# Patient Record
Sex: Female | Born: 1995 | Hispanic: Yes | Smoking: Never smoker
Health system: Southern US, Community
[De-identification: ages and names within clinical notes are randomized; demographics above are authoritative.]

## PROBLEM LIST (undated history)

## (undated) ENCOUNTER — Inpatient Hospital Stay (HOSPITAL_COMMUNITY): Payer: Self-pay

## (undated) DIAGNOSIS — R519 Headache, unspecified: Secondary | ICD-10-CM

## (undated) DIAGNOSIS — R569 Unspecified convulsions: Secondary | ICD-10-CM

## (undated) DIAGNOSIS — R51 Headache: Secondary | ICD-10-CM

## (undated) HISTORY — DX: Headache: R51

## (undated) HISTORY — DX: Headache, unspecified: R51.9

## (undated) HISTORY — PX: NO PAST SURGERIES: SHX2092

---

## 2014-06-15 ENCOUNTER — Encounter (HOSPITAL_COMMUNITY): Payer: Self-pay | Admitting: *Deleted

## 2014-06-15 ENCOUNTER — Emergency Department (HOSPITAL_COMMUNITY)
Admission: EM | Admit: 2014-06-15 | Discharge: 2014-06-15 | Disposition: A | Discharge status: Home / Self Care | Payer: Medicaid Other | Attending: Emergency Medicine | Admitting: Emergency Medicine

## 2014-06-15 DIAGNOSIS — J029 Acute pharyngitis, unspecified: Secondary | ICD-10-CM

## 2014-06-15 DIAGNOSIS — M79621 Pain in right upper arm: Secondary | ICD-10-CM | POA: Diagnosis not present

## 2014-06-15 DIAGNOSIS — R2231 Localized swelling, mass and lump, right upper limb: Secondary | ICD-10-CM | POA: Diagnosis present

## 2014-06-15 MED ORDER — IBUPROFEN 600 MG PO TABS: 600.0000 mg | ORAL_TABLET | Freq: Four times a day (QID) | ORAL | Status: DC | PRN

## 2014-06-15 NOTE — Discharge Instructions (Signed)
Please read and follow all provided instructions.  Your diagnoses today include:  1. Axillary pain, right   2. Sore throat    Tests performed today include:  Vital signs. See below for your results today.   Medications prescribed:   Ibuprofen (Motrin, Advil) - anti-inflammatory pain medication  Do not exceed 600mg  ibuprofen every 6 hours, take with food  You have been prescribed an anti-inflammatory medication or NSAID. Take with food. Take smallest effective dose for the shortest duration needed for your pain. Stop taking if you experience stomach pain or vomiting.   Take any prescribed medications only as directed.   Home care instructions:   Follow any educational materials contained in this packet  Follow-up instructions: Please follow-up with your primary care provider in the next 2 weeks for further evaluation of your symptoms if you do not feel better.   Return instructions:  Return to the Emergency Department if you have:  Fever  Worsening symptoms  Worsening pain  Worsening swelling  Redness of the skin that moves away from the affected area, especially if it streaks away from the affected area   Any other emergent concerns  Your vital signs today were: BP 135/81 mmHg   Pulse 102   Temp(Src) 98.2 F (36.8 C) (Oral)   Resp 24   SpO2 98%   LMP 06/01/2014 If your blood pressure (BP) was elevated above 135/85 this visit, please have this repeated by your doctor within one month. --------------

## 2014-06-15 NOTE — ED Notes (Signed)
The pt is c/o a lump under her rt arm for 3-4 days.  No temp  lmp this month  She  Also has a lump in her throat

## 2014-06-15 NOTE — ED Provider Notes (Signed)
CSN: [id number]     Arrival date & time 06/15/14  2231 History  This chart was scribed for non-physician practitioner, Rhea BleacherJosh Metha Kolasa, PA-C working with Elwin MochaBlair Walden, MD by Angelene GiovanniEmmanuella Mensah, ED Scribe. The patient was seen in room D32C/D32C and the patient's care was started at 10:53 PM      Chief Complaint  Patient presents with  . lump under rt arm    The history is provided by the patient. No language interpreter was used.   HPI Comments: Katherine Hess is a 19 y.o. female who presents to the Emergency Department complaining of lump under her right arm onset 2 days ago. She reports associated sore throat. She denies any fever, rhinorrhea, vomiting. She denies taking any medications for her symptoms. She denies any hx of skin infection or skin problems. No drainage from axilla. No drainage from breast.   History reviewed. No pertinent past medical history. History reviewed. No pertinent past surgical history. No family history on file. History  Substance Use Topics  . Smoking status: Never Smoker   . Smokeless tobacco: Not on file  . Alcohol Use: No   OB History    No data available     Review of Systems  Constitutional: Negative for fever, chills and fatigue.  HENT: Positive for sore throat. Negative for congestion, ear pain, rhinorrhea and sinus pressure.   Eyes: Negative for redness.  Respiratory: Negative for cough and wheezing.   Gastrointestinal: Negative for nausea, vomiting, abdominal pain and diarrhea.  Genitourinary: Negative for dysuria.  Musculoskeletal: Negative for myalgias and neck stiffness.  Skin: Negative for color change and rash.       Positive for lump in armpit.   Neurological: Negative for headaches.  Hematological: Negative for adenopathy.      Allergies  Review of patient's allergies indicates no known allergies.  Home Medications   Prior to Admission medications   Not on File   BP 135/81 mmHg  Pulse 102  Temp(Src) 98.2 F (36.8 C) (Oral)   Resp 24  SpO2 98%  LMP 06/01/2014 Physical Exam  Constitutional: She is oriented to person, place, and time. She appears well-developed and well-nourished. No distress.  HENT:  Head: Normocephalic and atraumatic.  Mouth/Throat: Oropharynx is clear and moist. No oropharyngeal exudate.  Eyes: Conjunctivae and EOM are normal. Right eye exhibits no discharge. Left eye exhibits no discharge.  Neck: Normal range of motion. Neck supple. No tracheal deviation present.  Full ROM of neck without pain. Patient reports some anterior neck tenderness to palpation. No definite lymphadenopathy. No erythema or cellulitis.   Cardiovascular: Normal rate, regular rhythm and normal heart sounds.   Pulmonary/Chest: Effort normal and breath sounds normal. No respiratory distress.  Abdominal: Soft. There is no tenderness.  Musculoskeletal: Normal range of motion.  Lymphadenopathy:    She has no cervical adenopathy.  Neurological: She is alert and oriented to person, place, and time.  Skin: Skin is warm and dry.  After approximately 2 minutes I palpated a small, pea-sized, tender nodule over the lateral aspect of the right breast, more so on the lateral aspect of the chest wall. No overlying erythema or cellulitis. Area soft, non-fluctuant. It has the consistency of a small lymph node. No other breast lumps.   Psychiatric: She has a normal mood and affect. Her behavior is normal.  Nursing note and vitals reviewed.   ED Course  Procedures (including critical care time) DIAGNOSTIC STUDIES: Oxygen Saturation is 98% on RA, normal by my interpretation.  COORDINATION OF CARE: 10:59 PM- Pt advised of plan for treatment and pt agrees.    Labs Review Labs Reviewed - No data to display  Imaging Review No results found.   EKG Interpretation None       Vital signs reviewed and are as follows: Filed Vitals:   06/15/14 2235  BP: 135/81  Pulse: 102  Temp: 98.2 F (36.8 C)  Resp: 24   Patient counseled  to use warm compresses and ibuprofen for next 2 weeks.   If area improves -- no further action.   If area worsens, she has worsening pain, swelling, expanding erythema especially if it streaks away from the affected area, or fever -- she should return to the ED for further evaluation.   If she continues to feel a tender lump in 2 weeks that is not changing -- she should follow-up with her primary care physician. She says she has one but cannot remember name right now.    Patient is in agreement with this plan.   The patient verbalized understanding and stated agreement with this plan.     MDM   Final diagnoses:  Axillary pain, right  Sore throat   Axillary pain: small deep nodule palpated with some effort. Doubt abscess. Possible sebaceous cyst. Possible reactive lymph node. Doubt neoplasm as area is tender but bears watching. Plan as above.   Sore throat: Mild, does not appear to have strep on exam, CENTOR 0/4. NSAIDs and monitor. Full ROM of neck, do not suspect deep abscess. Pt non-toxic. No difficulty breathing or swallowing.   I personally performed the services described in this documentation, which was scribed in my presence. The recorded information has been reviewed and is accurate.     Renne Crigler, PA-C 06/15/14 2324  Elwin Mocha, MD 06/15/14 [phone]

## 2014-07-26 ENCOUNTER — Emergency Department: Payer: Medicaid - Out of State

## 2014-07-26 ENCOUNTER — Emergency Department
Admission: EM | Admit: 2014-07-26 | Discharge: 2014-07-26 | Disposition: A | Discharge status: Home / Self Care | Payer: Medicaid - Out of State | Attending: Emergency Medicine | Admitting: Emergency Medicine

## 2014-07-26 DIAGNOSIS — R079 Chest pain, unspecified: Secondary | ICD-10-CM | POA: Insufficient documentation

## 2014-07-26 LAB — URINALYSIS, REFLEX TO MICROSCOPIC EXAM IF INDICATED
Bilirubin, UA: NEGATIVE
Blood, UA: NEGATIVE
Glucose, UA: NEGATIVE
Ketones UA: 40 — AB
Leukocyte Esterase, UA: NEGATIVE
Nitrite, UA: NEGATIVE
Specific Gravity UA: 1.019 (ref 1.001–1.035)
Urine pH: 5.5 (ref 5.0–8.0)
Urobilinogen, UA: NORMAL mg/dL

## 2014-07-26 LAB — COMPREHENSIVE METABOLIC PANEL
ALT: 14 U/L (ref 0–55)
AST (SGOT): 15 U/L (ref 5–34)
Albumin/Globulin Ratio: 1.8 (ref 0.9–2.2)
Albumin: 4.4 g/dL (ref 3.5–5.0)
Alkaline Phosphatase: 48 U/L — ABNORMAL LOW (ref 50–130)
BUN: 9 mg/dL (ref 7.0–19.0)
Bilirubin, Total: 0.4 mg/dL (ref 0.2–1.2)
CO2: 23 mEq/L (ref 22–29)
Calcium: 9.6 mg/dL (ref 8.5–10.5)
Chloride: 108 mEq/L (ref 100–111)
Creatinine: 0.7 mg/dL (ref 0.6–1.0)
Globulin: 2.4 g/dL (ref 2.0–3.6)
Glucose: 82 mg/dL (ref 70–100)
Potassium: 3.9 mEq/L (ref 3.5–5.1)
Protein, Total: 6.8 g/dL (ref 6.0–8.3)
Sodium: 141 mEq/L (ref 136–145)

## 2014-07-26 LAB — CBC AND DIFFERENTIAL
Basophils Absolute Automated: 0.05 10*3/uL (ref 0.00–0.20)
Basophils Automated: 0 %
Eosinophils Absolute Automated: 0.15 10*3/uL (ref 0.00–0.70)
Eosinophils Automated: 1 %
Hematocrit: 41.6 % (ref 37.0–47.0)
Hgb: 14.4 g/dL (ref 12.0–16.0)
Immature Granulocytes Absolute: 0.02 10*3/uL
Immature Granulocytes: 0 %
Lymphocytes Absolute Automated: 2.76 10*3/uL (ref 0.50–4.40)
Lymphocytes Automated: 25 %
MCH: 31.4 pg (ref 28.0–32.0)
MCHC: 34.6 g/dL (ref 32.0–36.0)
MCV: 90.8 fL (ref 80.0–100.0)
MPV: 10.7 fL (ref 9.4–12.3)
Monocytes Absolute Automated: 0.8 10*3/uL (ref 0.00–1.20)
Monocytes: 7 %
Neutrophils Absolute: 7.23 10*3/uL (ref 1.80–8.10)
Neutrophils: 66 %
Nucleated RBC: 0 /100 WBC (ref 0–1)
Platelets: 250 10*3/uL (ref 140–400)
RBC: 4.58 10*6/uL (ref 4.20–5.40)
RDW: 12 % (ref 12–15)
WBC: 11.01 10*3/uL — ABNORMAL HIGH (ref 3.50–10.80)

## 2014-07-26 LAB — POCT PREGNANCY TEST, URINE HCG: POCT Pregnancy HCG Test, UR: NEGATIVE

## 2014-07-26 LAB — LIPASE: Lipase: 21 U/L (ref 8–78)

## 2014-07-26 MED ORDER — IBUPROFEN 600 MG PO TABS
600.0000 mg | ORAL_TABLET | Freq: Once | ORAL | Status: AC
Start: 2014-07-26 — End: 2014-07-26
  Administered 2014-07-26: 600 mg via ORAL
  Filled 2014-07-26: qty 1

## 2014-07-26 NOTE — Discharge Instructions (Signed)
Chest Pain of Unclear Etiology    You have been seen for chest pain. The cause of your pain is not yet known.    Your doctor has learned about your medical history, examined you, and checked any tests that were done. Still, it is unclear why you are having pain. The doctor thinks there is only a very small chance that your pain is caused by a life-threatening condition. Later, your primary care doctor might do more tests or check you again.    Sometimes chest pain is caused by a dangerous condition, like a heart attack, aorta injury, blood clot in the lung, or collapsed lung. It is unlikely that your pain is caused by a life-threatening condition if: Your chest pain lasts only a few seconds at a time; you are not short of breath, nauseated (sick to your stomach), sweaty, or lightheaded; your pain gets worse when you twist or bend; your pain improves with exercise or hard work.    Chest pain is serious. It is VERY IMPORTANT that you follow up with your regular doctor and seek medical attention immediately here or at the nearest Emergency Department if your symptoms become worse or they change.    YOU SHOULD SEEK MEDICAL ATTENTION IMMEDIATELY, EITHER HERE OR AT THE NEAREST EMERGENCY DEPARTMENT, IF ANY OF THE FOLLOWING OCCURS:   Your pain gets worse.   Your pain makes you short of breath, nauseated, or sweaty.   Your pain gets worse when you walk, go up stairs, or exert yourself.   You feel weak, lightheaded, or faint.   It hurts to breathe.   Your leg swells.   Your symptoms get worse or you have new symptoms or concerns.    [place] Referral Line    You have been referred to a primary care doctor or a specialist for follow-up care. Please call the [place] referral line and they will be able to help you find a physician in your area that you can follow up with.    Phone: 1-855-IMG-DOCS or [phone]

## 2014-07-26 NOTE — ED Provider Notes (Signed)
[facility] EMERGENCY DEPARTMENT H&P         CLINICAL SUMMARY          Diagnosis:    .     Final diagnoses:   Chest pain, unspecified         MDM Notes:      Chest pain - no abdominal complaints on my history - most likely musculoskeletal.  No risks for PE -  CXR to rule out pneumonia - doubt PTX.    Disposition:         Discharge         New Prescriptions    No medications on file                 CLINICAL INFORMATION        HPI:      Chief Complaint: Chest Pain  .    Julia [name] is a 19 y.o. female with no pertinent PMHx who presents c/o substernal CP with associated headache for the past two days. She has not had any recent sick contacts. Non smoker. Not on any BC. LMP last month. Denies dysuria, hematuria, vomiting, diarrhea, fevers, SOB.     History obtained from: Patient      ROS:      Positive and negative ROS elements as per HPI.      Physical Exam:      Pulse 85  BP 137/77 mmHg  Resp 18  SpO2 100 %  Temp 98.3 F (36.8 C)  Physical Exam   Constitutional: She is oriented to person, place, and time. She appears well-developed and well-nourished.   WD female - alert, NAD   HENT:   Head: Normocephalic and atraumatic.   Eyes: Conjunctivae are normal. Pupils are equal, round, and reactive to light.   Neck: Normal range of motion. Neck supple. No JVD present. No tracheal deviation present. No thyromegaly present.   Cardiovascular: Normal rate, regular rhythm and intact distal pulses.  Exam reveals no gallop and no friction rub.    No murmur heard.  Pulmonary/Chest: Effort normal. No stridor. No respiratory distress. She has no wheezes. She has no rales. She exhibits no tenderness.   Mild anterior chest tenderness over sternal borders -   Reproduces pain.   Abdominal: Soft. Bowel sounds are normal. She exhibits no distension and no mass. There is no tenderness. There is no rebound and no guarding.   NO tenderness.   Musculoskeletal: Normal range of motion. She exhibits no edema  or tenderness.   Lymphadenopathy:     She has no cervical adenopathy.   Neurological: She is alert and oriented to person, place, and time. No cranial nerve deficit. She exhibits normal muscle tone. Coordination abnormal.   Skin: Skin is warm. No rash noted. No erythema. No pallor.   Psychiatric: She has a normal mood and affect. Her behavior is normal. Judgment and thought content normal.   Nursing note and vitals reviewed.                PAST HISTORY        Primary Care Provider: Christa See, MD        PMH/PSH:    .     History reviewed. No pertinent past medical history.    She has no past surgical history on file.      Social/Family History:      She reports that she has never smoked. She does not have any smokeless tobacco history on file.  She reports that she does not drink alcohol or use illicit drugs.    History reviewed. No pertinent family history.      Listed Medications on Arrival:    .     Home Medications     Last Medication Reconciliation Action:  Complete Venancio Poisson, RN 07/26/2014  6:51 PM          No Medications         Allergies: She has No Known Allergies.            VISIT INFORMATION        Clinical Course in the ED:            Medications Given in the ED:    .     ED Medication Orders     Start     Status Ordering Provider    07/26/14 1954  ibuprofen (ADVIL,MOTRIN) tablet 600 mg   Once     Route: Oral  Ordered Dose: 600 mg     Last MAR action:  Given Bolden Hagerman            Procedures:      Procedures      Interpretations:      O2 sat-           saturation: 100 %; Oxygen use: room air; Interpretation: Normal      EKG Interpretation at 8:30 PM by me: NSR 71, no ischemic changes.             RESULTS        Lab Results:      Results     Procedure Component Value Units Date/Time    Urine HCG, POC/ Qualitative [[id number]] Collected:  07/26/14 1819    Specimen Information:  Urine Updated:  07/26/14 1904     POCT QC Pass      POCT Pregnancy HCG Test, UR Negative      Comment:         Result:        Negative Value is Normal in Healthy Males or Healthy non-pregnant Females    Lipase [[id number]] Collected:  07/26/14 1823    Specimen Information:  Blood Updated:  07/26/14 1857     Lipase 21 U/L     Comprehensive metabolic panel [[id number]]  (Abnormal) Collected:  07/26/14 1823    Specimen Information:  Blood Updated:  07/26/14 1857     Glucose 82 mg/dL      BUN 9.0 mg/dL      Creatinine 0.7 mg/dL      Sodium 423 mEq/L      Potassium 3.9 mEq/L      Chloride 108 mEq/L      CO2 23 mEq/L      CALCIUM 9.6 mg/dL      Protein, Total 6.8 g/dL      Albumin 4.4 g/dL      AST (SGOT) 15 U/L      ALT 14 U/L      Alkaline Phosphatase 48 (L) U/L      Bilirubin, Total 0.4 mg/dL      Globulin 2.4 g/dL      Albumin/Globulin Ratio 1.8     UA, Reflex to Microscopic [[id number]]  (Abnormal) Collected:  07/26/14 1826    Specimen Information:  Urine Updated:  07/26/14 1852     Urine Type Clean Catch      Color, UA Yellow      Clarity, UA Cloudy (A)  Specific Gravity UA 1.019      Urine pH 5.5      Leukocyte Esterase, UA Negative      Nitrite, UA Negative      Protein, UR Trace (A)      Glucose, UA Negative      Ketones UA 40 (A)      Urobilinogen, UA Normal mg/dL      Bilirubin, UA Negative      Blood, UA Negative      RBC, UA 0 - 5 /hpf      WBC, UA 0 - 5 /hpf      Squamous Epithelial Cells, Urine 11 - 25 /hpf      Urine Mucus Present     CBC with differential [[id number]]  (Abnormal) Collected:  07/26/14 1823    Specimen Information:  Blood / Blood Updated:  07/26/14 1846     WBC 11.01 (H) x10 3/uL      Hgb 14.4 g/dL      Hematocrit 25.3 %      Platelets 250 x10 3/uL      RBC 4.58 x10 6/uL      MCV 90.8 fL      MCH 31.4 pg      MCHC 34.6 g/dL      RDW 12 %      MPV 10.7 fL      Neutrophils 66 %      Lymphocytes Automated 25 %      Monocytes 7 %      Eosinophils Automated 1 %      Basophils Automated 0 %      Immature Granulocyte 0 %      Nucleated RBC 0 /100 WBC      Neutrophils Absolute 7.23 x10 3/uL      Abs Lymph  Automated 2.76 x10 3/uL      Abs Mono Automated 0.80 x10 3/uL      Abs Eos Automated 0.15 x10 3/uL      Absolute Baso Automated 0.05 x10 3/uL      Absolute Immature Granulocyte 0.02 x10 3/uL               Radiology Results:      Chest 2 Views   Final Result      There is no evidence of acute disease.      Stephannie Peters, MD    07/26/2014 10:01 PM                     Scribe Attestation:      I was acting as a Neurosurgeon for Maurine Minister, MD on Julia [name]   I am the first provider for this patient and I personally performed the services documented. Joni Reining is scribing for me on Julia [name],Julia [name]. This note accurately reflects work and decisions made by me.  Maurine Minister, MD       10:11 PM    Feeling better after ibuprofen - stable for discharge.    Maurine Minister, MD  07/27/14 [phone]

## 2014-07-27 LAB — ECG 12-LEAD
Atrial Rate: 71 {beats}/min
P Axis: 39 degrees
P-R Interval: 138 ms
Q-T Interval: 398 ms
QRS Duration: 96 ms
QTC Calculation (Bezet): 432 ms
R Axis: 53 degrees
T Axis: 27 degrees
Ventricular Rate: 71 {beats}/min

## 2014-08-09 ENCOUNTER — Emergency Department
Admission: EM | Admit: 2014-08-09 | Discharge: 2014-08-09 | Disposition: A | Discharge status: Home / Self Care | Payer: Medicaid - Out of State | Attending: Emergency Medicine | Admitting: Emergency Medicine

## 2014-08-09 ENCOUNTER — Emergency Department: Payer: Medicaid - Out of State

## 2014-08-09 DIAGNOSIS — N644 Mastodynia: Secondary | ICD-10-CM | POA: Insufficient documentation

## 2014-08-09 DIAGNOSIS — N63 Unspecified lump in breast: Secondary | ICD-10-CM | POA: Insufficient documentation

## 2014-08-09 NOTE — Discharge Instructions (Signed)
1. Go to health department to obtain order for a mammogram    2. Then go to [facility] for a mammogram    3. Warm compresses 3 times a day    4. Tylenol and/or ibuprofen as needed for pain    5. Return to ER if develop worsening swelling, redness, fevers, vomiting, or drainage           Bulto en la mama.     Breast Lump     1.  Usted ha sido atendida por un bulto en su mama.   1.  You have been seen for a lump in your breast.             2.  Los bultos en la mama pueden ser benignos (no cancerosos) Tambin pueden ser EchoStar (cancerosos). Puede ser difcil diferenciar entre los bultos peligrosos y no peligrosos. Por lo tanto, se requieren ms pruebas. Este tipo de pruebas se hacen de Theme park manager. Esto significa que las pruebas se programan para otro momento sin tener que estar hospitalizada. Estas incluyen:   2.  Breast lumps can be benign (non-cancerous). They can also be malignant (cancerous). It can be hard to tell the difference between dangerous and non-dangerous lumps. Therefore, more testing is needed. This kind of testing is done as an outpatient. This means the tests are scheduled for a later time without staying in the hospital. These include:      * Ultrasonografa: un ultrasonido del bulto de la mama. Esto es para ver si est llena de lquido o slido.    * Ultrasonography: An ultrasound of the breast lump. This is to see if it is filled with fluid or is solid.      * Mamografa: Thereasa Solo es una radiografa especial para la mama. Encuentra bultos sospechosos de cncer.    * Mammography: A mammogram is a special x-ray of the breast. It finds lumps that are suspicious for cancer.      * Biopsia: con una biopsia, se toma una muestra del bulto. Generalmente se toma con Marella Bile. Luego se manda al laboratorio para Continental Airlines.    * Biopsy: With a biopsy, a sample of the lump is taken. It is usually taken with a needle. It then goes to the lab for  analysis.             3.  Enfermedad fibroqustica de las mamas: esta es una condicin comn. Afecta aproximadamente a un 20% (2 de 10) de las Land O'Lakes pubertad y los [place]. Generalmente desaparece con la menopausia. Es una condicin benigna (no cancerosa). Sin embargo, puede ser difcil de Home Depot bultos de los que son causados por cncer. Debido a esto, se deberan Investment banker, corporate de diagnstico. Esto es para asegurarse de que los bultos sean realmente por enfermedad fibroqustica. Algunos sntomas comunes de la enfermedad fibroqustica son:   3.  Fibrocystic Breast Disease: This is a common condition. It affects about 20% (2 out of 10) of women between the ages of puberty and 50 years. It usually goes away after menopause. It is a benign condition (not cancerous). However, it can be hard to tell the lumps apart from lumps caused by cancer. Because of this, other diagnostic tests should be done. These are to make sure the lumps are truly fibrocystic disease. Some common fibrocystic disease symptoms are:      * Es comn tener varios bultos. Generalmente afecta a ambas mamas. Tambin se ven  bultos solitarios.    * Multiple lumps are common. Both breasts are usually affected. Solitary (single) lumps are also seen.      * A menudo los bultos cambian de tamao con la menstruacin (perodos). Se hacen ms grandes antes de los perodos y Express Scripts se encogen.    * Lumps often change size during menstruation (periods). They get larger before periods and shrink after.      * Durante la menstruacin, generalmente las mamas pueden estar sensibles.    * During menstruation, breasts may be generally tender.             4.  Bultos por cncer de mama: a menudo es difcil diferenciar los bultos cancerosos de los que son causados por problemas menos peligrosos. Se necesitarn ms pruebas. Estas evaluarn a fondo los bultos de [place].   4.  Cancerous Breast Lumps: It is often  hard to tell cancerous lumps apart from those caused by less dangerous problems. More testing will be needed. These will fully evaluate the breast lumps.             5.  Recomendaciones sobre el mamograma: las recomendaciones sobre cundo Energy manager y con qu frecuencia realizarse otros pueden cambiar con Dowell. Pida a su mdico ms informacin sobre la [place].   5.  Mammogram recommendations: Recommendations about when to have the first mammogram and how often to get repeat mammograms may change over time. Ask your doctor for more information on mammography.      * Las recomendaciones actuales indican realizarse mamografas rutinarias una vez al ao a partir de los 40 aos.    * Current recommendations are for routine mammograms once a year starting at the age of 40.      * Si est en gran riesgo de cncer de mama, realcese una mamografa desde antes de los 40. Esto podra ser alrededor de los 30. Alto riesgo significa que tiene un fuerte historial familiar de cncer de mama u ovario o ha tenido tratamientos de radiacin en el pecho.    * If you are at high risk for breast cancer, get a mammogram when you are younger. This could be around the age of 35. High risk means you have a strong family history of breast or ovarian cancer or have had chest radiation treatment.                        Auto-exploraciones de seno (Edu.)     Breast-Self Examinations (Edu)     1.  Aqu tiene informacin sobre autoexploracin de seno.   1.  Here is some information on Breast Self-Examinations (BSE).             2.  Es importante realizar exmenes de sus senos mensualmente. Esto puede ayudarle a Chief Strategy Officer de seno a tiempo. Es muy importante porque es la mejor etapa para Engineer, manufacturing a Scientist, research (physical sciences) de seno. Es cuando hay ms posibilidades de ser curada. No todos los cnceres pueden encontrarse de Intel. Es un paso muy importante que puede hacer para usted y  Ambulance person.    2.  It is important to do monthly exams on your breasts. This can help you find breast cancer early. This is so important because this is the best time to find breast cancer is early. This is when it is most likely to be cured. Not every cancer can be found this way. It  is an important step you can do for yourself in your own home.              3.  Muchas mujeres piensan que las autoexploraciones de seno son frustrantes. Usted puede sentir cosas (masas o bultos) y no saber qu son. Mientras ms examine sus senos regularmente, ms aprender Hexion [employer]ty [employer]s. Entonces ser ms fcil de saber si hay algo anormal. La autoexploracin de seno es una parte importante de la salud preventiva.   3.  Many women find breast self-exams frustrating. You may feel things (lumps and bumps) and not know what they mean. The more you examine your breasts on a regular basis, the more you will learn about them. Then it will be easier to tell if something unusual shows up. BSE is an important part of preventative health.             4.  Procure hacerse una autoexploracin de seno una vez al mes. Feliberto Harts, se familiarizar con su cuerpo y con el aspecto y sensacin de sus senos. Hgase el examen unos das despus de que haya terminado su perodo. Es cuando sus senos son menos propensos a Warehouse manager hinchazn y Engineer, mining. Si ya no tiene el perodo, elija un da que le sea fcil de Clinical research associate. Por ejemplo, pudiera ser Mellon Financial primero o el ltimo del mes. Sea constante con CIGNA.   4.  Get in the habit of doing a breast self-examination once every month. This way, you will get familiar with your body and with how your breasts look and feel. Do the exam a few days after your period ends. This is when your breasts are least likely to be swollen and painful. If you do not have periods anymore, choose a day that s easy to remember. This could be the first or last day of the month. Be consistent in these days.              5.  Hay CINCO pasos para realizar su autoexploracin de seno:   5.  There are FIVE steps for your SBE:      * Comience mirando sus senos en el espejo. Ponga sus manos en sus caderas. Observe si el tamao, la forma y el color de sus senos son normales.    * Start by looking at your breasts in the mirror. Put your arms on your hips. Look for your normal breast size, shape and color.      * A continuacin, haga esto con ambos brazos levantados. Observe si hay hinchazn, hoyuelos, arrugas o bultos en la piel de forma anormal. Asegrese de que sus pezones no estn hundidos en lugar de salir hacia fuera como de costumbre. Ponga atencin a cualquier cosa que pudiera salir del pezn (secrecin).    * Then, do this with both your arms straight in the air. Look if the skin swells, dimples, puckers or bulges in a way that is not normal. Look for a nipple that has pushed inward instead of normally sticking outward. Watch for anything coming out of the nipple (discharge).      * Mientras mira al espejo, presione ligeramente ambos pezones y observe si tiene algn lquido o secrecin. La secrecin podra ser lechosa o de color amarillo. Incluso podra Intel.    * While looking into the mirror, gently squeeze both nipples to check for any fluid or discharge. The discharge could milky or yellow in color. It could even be bloody.      *  A continuacin, toque sus senos mientras est acostada. Toque su seno izquierdo con Psychiatric nurse. Luego toque su seno derecho con su mano izquierda. Use un tacto suave y firme con los primeros dedos de su mano. Mantenga sus dedos rectos y juntos. Revise el seno completo desde arriba Phoebe Sharps y de lado a lado. Haga esto desde su clavcula hasta la parte superior de su estmago y desde la axila hasta la zona del escote. Un aceite corporal o una crema hidratante podran facilitar los movimientos. Use un tacto suave y firme con los primeros dedos de su mano.  Mantenga sus dedos rectos y juntos. Asegrese de seguir un patrn. De esta manera podr asegurarse de revisar el seno completo. Comience en el pezn y desplace su mano en crculos cada vez ms grandes. Contine hasta llegar al extremo exterior del seno. Tambin puede mover sus dedos verticalmente, arriba y abajo, en hileras. Toque todo el tejido del seno: justo bajo su piel con un tacto suave y ms profundamente con un toque firme. Comience a examinar cada zona con un tacto muy suave. Luego, aplique ms presin para que pueda sentir el tejido ms profundo, bajo sus costillas.    * Next, feel your breasts while lying down. Feel your left breast with your right hand. Then feel your right breast with your left hand. Use a firm, smooth touch with the first few fingers of your hand. Keep the fingers flat and together. Cover the entire breast from top to bottom and side to side. Do this from your collarbone to the top of your belly and from your armpit to your cleavage. Body oil or moisturizer may make the movements flow more easily. Use a firm, smooth touch with the first few fingers of your hand. Keep the fingers flat and together. Be sure to follow a pattern. This way, you can be sure to cover the entire breast. Start at the nipple and move in larger and larger circles. Continue until you reach the outer edge of the breast. You can also move your fingers up and down vertically, in rows. Feel all the breast tissue: just under your skin with a soft touch and down deeper with a firmer touch. Start examining each area with a very soft touch. Then, use more pressure so you can feel the deeper tissue, down to your ribs.      * Para el ltimo paso, toque sus senos mientras est de pie o sentada. Muchas mujeres encuentran que la manera ms fcil de tocar sus senos es cuando su piel est hmeda y [place]. Esto se hace mejor en la ducha. Revise su seno completo, usando los mismos movimientos de la mano descritos en el paso  anterior.    * For the last step, feel your breasts while standing or sitting. Many women find that the easiest way to feel their breasts is when their skin is wet and slippery. This is best done in the shower. Cover your entire breast, using the same hand movements described in the previous step.             6.  No se asuste si cree que nota una masa pequea. La Harley-Davidson de las mujeres tienen masas pequeas o zonas con bultos en sus senos todo [place]. Ocho de cada diez masas pequeas que son retiradas de los senos son benignas. Esto significa que no son cancerosas.   6.  Don t panic if you think you feel a lump. Most women  have lumps or lumpy areas in their breasts all the time. Eight out of ten breast lumps that are removed are benign. This means they are not cancerous.             7.  Si tiene dudas o preguntas, hgaselo saber a su especialista y programe una revisin. Haga lo mismo si nota algn cambio en sus autoexploraciones.   7.  If you have any concerns or questions, tell your health care provider and schedule a check-up. Do the same if you notice any changes in your self-exams.             8.  Las autoexploraciones de seno regulares, junto con un examen anual realizado por un mdico, aumentan las posibilidades de Clinical research associate un cncer a tiempo.   8.  Regular breast self-exams, together with an annual exam by a doctor, improve the chances of finding cancer early.             9.  Para ms informacin, pngase en contacto con la American Cancer Society (Sociedad Americana del Cncer). Tambin puede visitar www.breastcancer.org   9.  For more information contact the American Cancer Society. You can also visit www.breastcancer.org                 Low Cost Healthcare Resources in [facility]/Federally Qualified Health Centers    [facility]   www.https://www.krueger.org/    [facility]  [address]  [place], [place] [address]   [phone]    Pediatrics, [address], [place], [place] [address], [phone]    [facility] and Mental [facility] Services, [phone]     ---------------------------------------------------------------------------------------  [facility], [address], [place], [place] [address], [phone]  [email or web address]    [facility], [address], [place], [place] [address], [phone]         ---------------------------------------------------------------------------------------  (Greater) Lajoyce Lauber [facility], [facility] [address] [facility] [address], [place], [place] [address], [phone], [address] [place], [place] [address], [phone]    ---------------------------------------------------------------------------------------    Health Departments    ---------------------------------------------------------------------------------------    [facility], [address] [place], [place] [address], [phone]  www.[email or web address]    [facility], [address], [place], [place] [address], [phone]  [email or web address]    ---------------------------------------------------------------------------------------  [facility], [address] [place], [place] [address], [phone]    ---------------------------------------------------------------------------------------  [facility], [address], [place], [place] [address], [phone]    [facility], [address], [place], [place] [address], [phone]  [email or web address]    [facility], [address], [place], [place] [address], [phone], [address], [place], [place] [address], [phone]    [facility], [address], [place], [place] [address], [phone]    ---------------------------------------------------------------------------------------  [facility], [address], [place], [place] [address], [phone]    ---------------------------------------------------------------------------------------  [facility], [address], [place], [place] [address], [phone]    [facility], [phone]    ---------------------------------------------------------------------------------------  [facility], [address], [place], [place] [address], [phone]  [facility], [address], [place], [place] [address], [phone]  [facility], [place] [place], [place] [address], [phone], [address] [place], [place], [place] [address], [phone]    ---------------------------------------------------------------------------------------    Free Clinics    ---------------------------------------------------------------------------------------  ---------------------------------------------------------------------------------------  [facility],   CondoSitters.be    [facility],  http://www.saunders.info/    [facility],   fleettags.com    [facility], www.http://www.murphy-norris.com/    [place] Association of Free Clinics, www.vafreeclinics.org/Anthony-free-clinics.asp#culc    ---------------------------------------------------------------------------------------    Other Resources    ---------------------------------------------------------------------------------------  ---------------------------------------------------------------------------------------  [facility], [place] [address], [place], [place] [address],  [phone]  [email or web address] [employer], [address] [place], [place] [address], [phone]  [email or web address]    [facility], [address], [place], [place] [address], [phone]    ---------------------------------------------------------------------------------------    Dental    ---------------------------------------------------------------------------------------  ---------------------------------------------------------------------------------------  [facility], [address], [place], [place] [address], [phone]    [facility], [address] [place], [place], [place] [address], [phone]  By appointment only    [facility]  [address]), [address] [place] [place],  [address], [place]  [address] [place], [place], [place], [id number], [place], [place], [address] [place], [place]. [place], [place], [place]  [place], [id number], [place], [place], [place]       ---------------------------------------------------------------------------------------  [place]    ---------------------------------------------------------------------------------------  ---------------------------------------------------------------------------------------  Joetta Manners for Health Coverage Education, http://www.rice.biz/    EHealthInsurance,   http://www.ehealthinsurance.[place],   http://www.healthinsurancesort.com    Health Insurance Online,   http://www.online-health-insurance.com    Health Plan One,   http://www.healthplanone.com    PodExchange.nl,    http://www.healthcare.[place], http://www.novaclinics.org/home  Co[place]ition  [facility])    ---------------------------------------------------------------------------------------    Medication Resources    ---------------------------------------------------------------------------------------  ---------------------------------------------------------------------------------------  [employer], http://www.fairfaxrxdiscountcard.com, [phone], EXT 5 Helpdesk    Partnership for Prescriptions Assistance,  http://www.pparx.org    NeedyMeds,       http://www.needymeds.Foot Locker,    [place] of [employer],      GermanNightclub.ch    [place] (NAIC),      http://www.insureuonline.[place],     StatisticsWire.com.au

## 2014-08-09 NOTE — ED Notes (Signed)
Bed: N I  Expected date:   Expected time:   Means of arrival:   Comments:

## 2014-08-11 NOTE — ED Provider Notes (Signed)
Physician/Midlevel provider first contact with patient: 08/09/14 1838         History     Chief Complaint   Patient presents with   . Headache   . Breast Pain     HPI     19 yo f presents to ER reporting via interpreter, "My left breast has been hurting me in several areas for past several days. I feel small ball-like knots that hurt when I press on it. I've also had a cold over past 2-3 days." +Mild cough (non-productive), headache, congestion. Last night patient vomited x 1. Patient denies any current nausea. No cp or sob. Patient taking PO without any difficulty. No rash or fevers/chills. No breast discharge. Patient denies any family h/o breast cancer.    PMD: none    No past medical history on file.    No past surgical history on file.    No family history on file.    Social  History   Substance Use Topics   . Smoking status: Never Smoker    . Smokeless tobacco: Not on file   . Alcohol Use: No       .     No Known Allergies    Home Medications     None on File           Review of Systems   Constitutional: Negative for fever and chills.   HENT: Positive for congestion and rhinorrhea. Negative for facial swelling and sore throat.    Eyes: Negative for redness and visual disturbance.   Respiratory: Positive for cough. Negative for shortness of breath.    Cardiovascular: Negative for chest pain and palpitations.   Gastrointestinal: Positive for vomiting. Negative for nausea, abdominal pain and diarrhea.   Genitourinary: Negative for dysuria, frequency and difficulty urinating.   Musculoskeletal: Negative for back pain and neck pain.   Skin: Negative for color change, rash and wound.   Neurological: Positive for headaches. Negative for dizziness and weakness.   Psychiatric/Behavioral: Negative for behavioral problems, confusion, decreased concentration and agitation.       Physical Exam    BP: 132/88 mmHg, Heart Rate: 79, Temp: 98.9 F (37.2 C), Resp Rate: 16, SpO2: 97 %, Weight: 99.338 kg    Physical Exam      Constitutional: She is oriented to person, place, and time. She appears well-developed and well-nourished. No distress.   HENT:   Head: Normocephalic and atraumatic.   Nose: Nose normal.   Mouth/Throat: Oropharynx is clear and moist.   Eyes: Conjunctivae and EOM are normal. Pupils are equal, round, and reactive to light. Right eye exhibits no discharge. Left eye exhibits no discharge. No scleral icterus.   Neck: Normal range of motion. Neck supple.   Cardiovascular:       Pulmonary/Chest: Effort normal. No stridor. No respiratory distress.   No fluctuance, erythema, edema    No drainage    No deformity   Musculoskeletal: Normal range of motion. She exhibits no edema or tenderness.   Neurological: She is alert and oriented to person, place, and time.   Skin: Skin is warm and dry. No rash noted. She is not diaphoretic. No erythema.   Psychiatric: She has a normal mood and affect. Her behavior is normal. Judgment and thought content normal.   Nursing note and vitals reviewed.        MDM and ED Course     ED Medication Orders     None  MDM     URI with left-sided breast nodules    Pox 97% on RA, normal    Patient referred to Health Department for mammogram order and then referral to [facility], explained with interpreter, understands will f/u.    Procedures    Clinical Impression & Disposition     Clinical Impression  Final diagnoses:   Painful lumpy breasts, left        ED Disposition     Discharge Julia [name] discharge to home/self care.    Condition at disposition: Stable             There are no discharge medications for this patient.                  Alcide Evener, MD  08/11/14 [phone]

## 2014-12-18 ENCOUNTER — Emergency Department
Admission: EM | Admit: 2014-12-18 | Discharge: 2014-12-18 | Disposition: A | Discharge status: Home / Self Care | Payer: Medicaid Other | Attending: Emergency Medicine | Admitting: Emergency Medicine

## 2014-12-18 ENCOUNTER — Emergency Department: Payer: Charity

## 2014-12-18 ENCOUNTER — Emergency Department: Payer: Medicaid Other

## 2014-12-18 DIAGNOSIS — O26891 Other specified pregnancy related conditions, first trimester: Secondary | ICD-10-CM | POA: Insufficient documentation

## 2014-12-18 DIAGNOSIS — Z3A08 8 weeks gestation of pregnancy: Secondary | ICD-10-CM | POA: Insufficient documentation

## 2014-12-18 DIAGNOSIS — R109 Unspecified abdominal pain: Secondary | ICD-10-CM

## 2014-12-18 DIAGNOSIS — O26899 Other specified pregnancy related conditions, unspecified trimester: Secondary | ICD-10-CM

## 2014-12-18 LAB — URINALYSIS, REFLEX TO MICROSCOPIC EXAM IF INDICATED
Bilirubin, UA: NEGATIVE
Blood, UA: NEGATIVE
Glucose, UA: NEGATIVE
Ketones UA: NEGATIVE
Nitrite, UA: NEGATIVE
Specific Gravity UA: 1.012 (ref 1.001–1.035)
Urine pH: 5.5 (ref 5.0–8.0)
Urobilinogen, UA: NORMAL mg/dL

## 2014-12-18 LAB — COMPREHENSIVE METABOLIC PANEL
ALT: 108 U/L — ABNORMAL HIGH (ref 0–55)
AST (SGOT): 55 U/L — ABNORMAL HIGH (ref 5–34)
Albumin/Globulin Ratio: 1.5 (ref 0.9–2.2)
Albumin: 4.2 g/dL (ref 3.5–5.0)
Alkaline Phosphatase: 61 U/L (ref 50–130)
BUN: 8 mg/dL (ref 7.0–19.0)
Bilirubin, Total: 0.3 mg/dL (ref 0.2–1.2)
CO2: 19 mEq/L — ABNORMAL LOW (ref 22–29)
Calcium: 9.5 mg/dL (ref 8.5–10.5)
Chloride: 107 mEq/L (ref 100–111)
Creatinine: 0.6 mg/dL (ref 0.6–1.0)
Globulin: 2.8 g/dL (ref 2.0–3.6)
Glucose: 91 mg/dL (ref 70–100)
Potassium: 3.8 mEq/L (ref 3.5–5.1)
Protein, Total: 7 g/dL (ref 6.0–8.3)
Sodium: 138 mEq/L (ref 136–145)

## 2014-12-18 LAB — CBC AND DIFFERENTIAL
Basophils Absolute Automated: 0.06 10*3/uL (ref 0.00–0.20)
Basophils Automated: 0 %
Eosinophils Absolute Automated: 0.11 10*3/uL (ref 0.00–0.70)
Eosinophils Automated: 1 %
Hematocrit: 38.7 % (ref 37.0–47.0)
Hgb: 13.6 g/dL (ref 12.0–16.0)
Immature Granulocytes Absolute: 0.04 10*3/uL
Immature Granulocytes: 0 %
Lymphocytes Absolute Automated: 2.25 10*3/uL (ref 0.50–4.40)
Lymphocytes Automated: 14 %
MCH: 32.2 pg — ABNORMAL HIGH (ref 28.0–32.0)
MCHC: 35.1 g/dL (ref 32.0–36.0)
MCV: 91.5 fL (ref 80.0–100.0)
MPV: 11 fL (ref 9.4–12.3)
Monocytes Absolute Automated: 1.12 10*3/uL (ref 0.00–1.20)
Monocytes: 7 %
Neutrophils Absolute: 12.41 10*3/uL — ABNORMAL HIGH (ref 1.80–8.10)
Neutrophils: 78 %
Nucleated RBC: 0 /100 WBC (ref 0–1)
Platelets: 251 10*3/uL (ref 140–400)
RBC: 4.23 10*6/uL (ref 4.20–5.40)
RDW: 12 % (ref 12–15)
WBC: 15.99 10*3/uL — ABNORMAL HIGH (ref 3.50–10.80)

## 2014-12-18 MED ORDER — ACETAMINOPHEN 500 MG PO TABS
1000.0000 mg | ORAL_TABLET | Freq: Once | ORAL | Status: AC
Start: 2014-12-18 — End: 2014-12-18
  Administered 2014-12-18: 1000 mg via ORAL
  Filled 2014-12-18: qty 2

## 2014-12-18 NOTE — ED Notes (Signed)
Bed: S C5  Expected date:   Expected time:   Means of arrival:   Comments:

## 2014-12-18 NOTE — ED Provider Notes (Signed)
Patient is [redacted] weeks pregnant, presents with lower abdominal pain and nausea with headache.    No vaginal bleeding    I have briefly evaluated this patient as triage physician in order to facilitate and initiate the ordering of laboratory and imaging studies as needed.      Baltazar Najjar, MD  12/18/14 [phone]

## 2014-12-18 NOTE — Discharge Instructions (Signed)
You may take only tylenol for pain.  Please return to ER for any bleeding or worsening symptoms.       Dolor abdominal     Abdominal Pain     1.  Se le ha diagnosticado dolor abdominal (de estmago). An se desconoce la causa de su dolor.    1.  You have been diagnosed with abdominal (belly) pain. The cause of your pain is not yet known.             2.  Hay muchas causas de dolor abdominal. stas van desde infecciones virales hasta espasmos intestinales. Puede que necesite otro examen peridico o ms pruebas para determinar la causa del dolor.   2.  Many things can cause abdominal pain. Examples include viral infections and bowel (intestine) spasms. You might need another examination or more tests to find out why you have pain.             3.  En este momento, parece ser que su dolor no es resultado de una condicin peligrosa. Usted no requiere de [place]. No necesita permanecer en el hospital.    3.  At this time, your pain does not seem to be caused by anything dangerous. You do not need surgery. You do not need to stay in the hospital.              4.  Aunque creemos que su condicin no es peligrosa por [address], es importante que tenga cuidado. A veces, un problema que parece leve puede convertirse en algo serio despus. Por lo Blaine Hamper, es muy importante que regrese aqu o se dirija a la [facility] a menos que est 100% mejor.   4.  Though we don't believe your condition is dangerous right now, it is important to be careful. Sometimes a problem that seems mild can become serious later. This is why it is very important that you return here or go to the nearest Emergency Department unless you are 100% improved.             5.  DEBE BUSCAR ATENCIN MDICA INMEDIATA, AQU O EN LA [facility], SI SE PRESENTA CUALQUIERA DE LAS SIGUIENTES SITUACIONES:    5.  YOU SHOULD SEEK MEDICAL ATTENTION IMMEDIATELY, EITHER HERE OR AT THE  NEAREST EMERGENCY DEPARTMENT, IF ANY OF THE FOLLOWING OCCURS:      * El dolor contina o [place].     * Your pain does not go away or gets worse.      * No puede retener lquidos en el estmago o su vmito es verde oscuro.     * You cannot keep fluids down or your vomit is dark green.       * Vomita sangre o hay sangre en sus heces. La sangre puede ser de color rojo brillante o rojo oscuro. Tambin puede ser negra o como alquitrn.    * You vomit blood or see blood in your stool. Blood might be bright red or dark red. It can also be black and look like tar.      * Tiene fiebre (temperatura mayor de 100.42F / 38C) o escalofros.    * You have a fever (temperature higher than 100.42F / 38C) or shaking chills.      * Su piel o sus ojos se ven amarillos o su orina tiene un color caf.     * Your skin or eyes look yellow or your urine  looks brown.      * Tiene una fuerte diarrea.    * You have severe diarrhea.

## 2014-12-18 NOTE — ED Provider Notes (Shared)
[facility] EMERGENCY DEPARTMENT H&P      Visit date: 12/18/2014      CLINICAL SUMMARY          Diagnosis:    .     Final diagnoses:   Abdominal pain in pregnancy         MDM Notes:            Disposition:          ED Disposition     Discharge Montez Morita discharge to home/self care.    Condition at disposition: Stable                       CLINICAL INFORMATION        HPI:      Chief Complaint: Abdominal Pain  .    Julia [name] is a 19 y.o. female who is [redacted] wks pregnant presenting with abd pain since yesterday morning. A/w HA and nausea.   Pt was seen by ObGyn last Tuesday.  Last menstrual period on 5/24.  No fever, vaginal bleeding, dysuria.     History obtained from: patient, Spanish interpreter          ROS:      Positive and negative ROS elements as per HPI.  All other systems reviewed and negative.      Physical Exam:      Pulse 120  BP 119/78 mmHg  Resp 18  SpO2 98 %  Temp 99.2 F (37.3 C)    Physical Exam   Constitutional: She is oriented to person, place, and time. She appears well-developed and well-nourished. No distress.   HENT:   Head: Normocephalic and atraumatic.   Mouth/Throat: Oropharynx is clear and moist.   Eyes: Conjunctivae and EOM are normal. Pupils are equal, round, and reactive to light.   Neck: Normal range of motion.   Cardiovascular: Normal rate, regular rhythm and normal heart sounds.    Pulmonary/Chest: Effort normal and breath sounds normal.   Abdominal: Soft. Bowel sounds are normal.   Abd TTP.   Musculoskeletal: Normal range of motion. She exhibits no edema or tenderness.   Neurological: She is alert and oriented to person, place, and time. No cranial nerve deficit.   Skin: Skin is warm and dry. She is not diaphoretic.   Psychiatric: She has a normal mood and affect. Her behavior is normal. Judgment and thought content normal.   Nursing note and vitals reviewed.                 PAST HISTORY        Primary Care Provider: Christa See, MD         PMH/PSH:    .     History reviewed. No pertinent past medical history.    She has no past surgical history on file.      Social/Family History:      She reports that she has never smoked. She does not have any smokeless tobacco history on file. She reports that she does not drink alcohol or use illicit drugs.    No family history on file.      Listed Medications on Arrival:    .     Home Medications                   Prenatal Multivit-Min-Fe-FA (PRENATAL VITAMINS PO)     Take by mouth.         Allergies: She has No Known Allergies.  VISIT INFORMATION        Clinical Course in the ED:            Medications Given in the ED:    .     ED Medication Orders     Start Ordered     Status Ordering Provider    12/18/14 1935 12/18/14 1934  acetaminophen (TYLENOL) tablet 1,000 mg   Once     Route: Oral  Ordered Dose: 1,000 mg     Last MAR action:  Given SLIVKA, RACHAEL SARAH            Procedures:      Procedures      Interpretations:      O2 sat-           saturation: 98 %; Oxygen use: room air; Interpretation: Normal    Radiology -     interpreted by me with the following observations: ***                 RESULTS        Lab Results:      Results     Procedure Component Value Units Date/Time    Comprehensive metabolic panel [[id number]]  (Abnormal) Collected:  12/18/14 2019    Specimen Information:  Blood Updated:  12/18/14 2053     Glucose 91 mg/dL      BUN 8.0 mg/dL      Creatinine 0.6 mg/dL      Sodium 119 mEq/L      Potassium 3.8 mEq/L      Chloride 107 mEq/L      CO2 19 (L) mEq/L      Calcium 9.5 mg/dL      Protein, Total 7.0 g/dL      Albumin 4.2 g/dL      AST (SGOT) 55 (H) U/L      ALT 108 (H) U/L      Alkaline Phosphatase 61 U/L      Bilirubin, Total 0.3 mg/dL      Globulin 2.8 g/dL      Albumin/Globulin Ratio 1.5     UA, Reflex to Microscopic (pts  3 + yrs) [[id number]]  (Abnormal) Collected:  12/18/14 2019    Specimen Information:  Urine Updated:  12/18/14 2044     Urine Type Clean Catch      Color, UA  Yellow      Clarity, UA Cloudy (A)      Specific Gravity UA 1.012      Urine pH 5.5      Leukocyte Esterase, UA Trace (A)      Nitrite, UA Negative      Protein, UR Trace (A)      Glucose, UA Negative      Ketones UA Negative      Urobilinogen, UA Normal mg/dL      Bilirubin, UA Negative      Blood, UA Negative      RBC, UA 0 - 5 /hpf      WBC, UA 0 - 5 /hpf      Squamous Epithelial Cells, Urine 6 - 10 /hpf      Urine Mucus Present     CBC with differential [[id number]]  (Abnormal) Collected:  12/18/14 2019    Specimen Information:  Blood from Blood Updated:  12/18/14 2039     WBC 15.99 (H) x10 3/uL      Hgb 13.6 g/dL      Hematocrit 69.6 %  Platelets 251 x10 3/uL      RBC 4.23 x10 6/uL      MCV 91.5 fL      MCH 32.2 (H) pg      MCHC 35.1 g/dL      RDW 12 %      MPV 11.0 fL      Neutrophils 78 %      Lymphocytes Automated 14 %      Monocytes 7 %      Eosinophils Automated 1 %      Basophils Automated 0 %      Immature Granulocyte 0 %      Nucleated RBC 0 /100 WBC      Neutrophils Absolute 12.41 (H) x10 3/uL      Abs Lymph Automated 2.25 x10 3/uL      Abs Mono Automated 1.12 x10 3/uL      Abs Eos Automated 0.11 x10 3/uL      Absolute Baso Automated 0.06 x10 3/uL      Absolute Immature Granulocyte 0.04 x10 3/uL               Radiology Results:      US OB < 14 Weeks with Transvag   Final Result      1. Single, living intrauterine pregnancy with an estimated gestation age   of 8 weeks and 3 days.  This corresponds with a due date of 07/27/2015.   2. No significant uterine or adnexal abnormality is identified.      Gerlene Burdock, MD    12/18/2014 10:05 PM                     Scribe Attestation:      I was acting as a Neurosurgeon for Dorothey Baseman, MD on Letitia Caul  I am the first provider for this patient and I personally performed the services documented. Jacqulyn Cane is scribing for me on Piehl,Dejanira. This note accurately reflects work and decisions made by me.   Dorothey Baseman, MD

## 2014-12-27 ENCOUNTER — Encounter (HOSPITAL_BASED_OUTPATIENT_CLINIC_OR_DEPARTMENT_OTHER): Payer: Self-pay

## 2014-12-30 ENCOUNTER — Ambulatory Visit: Payer: Medicaid Other | Attending: Obstetrics & Gynecology

## 2014-12-30 ENCOUNTER — Encounter (INDEPENDENT_AMBULATORY_CARE_PROVIDER_SITE_OTHER): Payer: Self-pay

## 2014-12-30 ENCOUNTER — Encounter (INDEPENDENT_AMBULATORY_CARE_PROVIDER_SITE_OTHER): Payer: Self-pay | Admitting: Obstetrics & Gynecology

## 2014-12-30 VITALS — BP 107/63 | Ht 66.0 in | Wt 200.0 lb

## 2014-12-30 DIAGNOSIS — Z6833 Body mass index (BMI) 33.0-33.9, adult: Secondary | ICD-10-CM | POA: Insufficient documentation

## 2014-12-30 DIAGNOSIS — Z3401 Encounter for supervision of normal first pregnancy, first trimester: Secondary | ICD-10-CM

## 2014-12-30 DIAGNOSIS — Z833 Family history of diabetes mellitus: Secondary | ICD-10-CM | POA: Insufficient documentation

## 2014-12-30 DIAGNOSIS — O09891 Supervision of other high risk pregnancies, first trimester: Secondary | ICD-10-CM | POA: Insufficient documentation

## 2014-12-30 DIAGNOSIS — O9989 Other specified diseases and conditions complicating pregnancy, childbirth and the puerperium: Secondary | ICD-10-CM | POA: Insufficient documentation

## 2014-12-30 LAB — CBC
Hematocrit: 38.7 % (ref 37.0–47.0)
Hgb: 13 g/dL (ref 12.0–16.0)
MCH: 31.4 pg (ref 28.0–32.0)
MCHC: 33.6 g/dL (ref 32.0–36.0)
MCV: 93.5 fL (ref 80.0–100.0)
MPV: 11.3 fL (ref 9.4–12.3)
Nucleated RBC: 0 /100 WBC (ref 0–1)
Platelets: 247 10*3/uL (ref 140–400)
RBC: 4.14 10*6/uL — ABNORMAL LOW (ref 4.20–5.40)
RDW: 12 % (ref 12–15)
WBC: 11.68 10*3/uL — ABNORMAL HIGH (ref 3.50–10.80)

## 2014-12-30 LAB — HEMOLYSIS INDEX: Hemolysis Index: 10 (ref 0–18)

## 2014-12-30 LAB — HEPATITIS B SURFACE ANTIGEN W/ REFLEX TO CONFIRMATION: Hepatitis B Surface Antigen: NONREACTIVE

## 2014-12-30 LAB — GLUCOSE CHALLENGE: Glucose Challenge: 124 mg/dL

## 2014-12-30 LAB — HIV AG/AB 4TH GENERATION: HIV Ag/Ab, 4th Generation: NONREACTIVE

## 2014-12-30 NOTE — Progress Notes (Signed)
1. Pregnant patient  traveled to a Zika-affected area within the past 12 weeks    No                                                                               2. Pregnant patient had possible sexual exposure to [place] virus* within the past 12 weeks    No    3. Pregnant patient has ?2 symptoms of Zika and had mosquito bite(s) in the 2 weeks before symptom onset   No    Zika counseling done    Patient presents to clinic for new OB intake, .  No Hx of Pap smear.  Glucose test done today due to family Hx in father and BMI of 33.9  Had a ED visit on 12-18-14 due to abdominal pain and fever, today denies pain, fever and vaginal bleeding, reports feeling well and has no concerns at this time  Pregnancy handbook , community resources packet, schedule of classes, female provider availability, all  given and discussed with  patient.Teaching CHECKLIST addressed  in EPIC. Breastfeeding handout given and discussed.Social History completed. Kick count explained. Appointment card details discussed with patient. Patient verbalized understanding of teaching materials.  QFT appt on 01-06-15  Abriel Geesey,RN

## 2014-12-31 LAB — PRENATAL  WORKUP
AB Screen Gel: NEGATIVE
ABO Rh: O POS

## 2014-12-31 LAB — RPR: RPR: NONREACTIVE

## 2015-01-01 LAB — GONOCOCCUS CULTURE
Chlamydia trachomatis Culture: NEGATIVE
Culture Gonorrhoeae: NEGATIVE

## 2015-01-01 LAB — HEMOGLOBINOPATHY EVALUATION W/O HEMOGRAM
Hemoglobin A2: 2.7 % (ref 1.8–3.5)
Hemoglobin A: 97.3 % (ref >96.0)
Hemoglobin F: 0 % (ref <2.0)

## 2015-01-01 LAB — RUBELLA ANTIBODY, IGG: Rubella AB, IgG: 2.6

## 2015-01-06 LAB — QUANTIFERON(R)-TB GOLD ITM (HLAB): Quantiferon: NEGATIVE

## 2015-01-13 ENCOUNTER — Encounter (INDEPENDENT_AMBULATORY_CARE_PROVIDER_SITE_OTHER): Payer: Self-pay | Admitting: Family

## 2015-01-13 ENCOUNTER — Ambulatory Visit (INDEPENDENT_AMBULATORY_CARE_PROVIDER_SITE_OTHER): Payer: Medicaid Other | Admitting: Family

## 2015-01-13 DIAGNOSIS — Z3401 Encounter for supervision of normal first pregnancy, first trimester: Secondary | ICD-10-CM

## 2015-01-13 DIAGNOSIS — O09891 Supervision of other high risk pregnancies, first trimester: Secondary | ICD-10-CM

## 2015-01-13 DIAGNOSIS — Z833 Family history of diabetes mellitus: Secondary | ICD-10-CM

## 2015-01-13 DIAGNOSIS — Z6833 Body mass index (BMI) 33.0-33.9, adult: Secondary | ICD-10-CM

## 2015-01-13 LAB — POCT PROTEIN, URINE, QUALITATIVE, DIPSTICK: POCT Protein, UA: NEGATIVE mg/dL

## 2015-01-13 LAB — POCT GLUCOSE, URINE, QUALITATIVE, DIPSTICK: Glucose, UA: NEGATIVE

## 2015-01-13 NOTE — Progress Notes (Signed)
1. Pregnant patient  traveled to a Zika-affected area within the past 12 weeks    No                                                                               2. Pregnant patient had possible sexual exposure to Zika virus* within the past 12 weeks    No    3. Pregnant patient has ?2 symptoms of Zika and had mosquito bite(s) in the 2 weeks before symptom onset   No

## 2015-01-13 NOTE — Progress Notes (Signed)
NOB who presents at 12 weeks by LMP  Feeling well, no nausea  MedHx: denies problems. Family hx of DM  OB/GYN Hx: no STDs, first pregnancy  Labs: wnl, Ops, neg cultures, urine culture today, PAP when 20  Quad and sono to be ordered at next visit  SocHx: lives with FOB, not working at this time, when to some HS in [place]  Precautions reviewed

## 2015-01-14 ENCOUNTER — Encounter (INDEPENDENT_AMBULATORY_CARE_PROVIDER_SITE_OTHER): Payer: Self-pay | Admitting: Obstetrics & Gynecology

## 2015-02-10 ENCOUNTER — Ambulatory Visit: Payer: Medicaid Other | Attending: Obstetrics & Gynecology | Admitting: Family

## 2015-02-10 VITALS — BP 107/73 | Wt 196.3 lb

## 2015-02-10 DIAGNOSIS — Z3401 Encounter for supervision of normal first pregnancy, first trimester: Secondary | ICD-10-CM | POA: Insufficient documentation

## 2015-02-10 DIAGNOSIS — Z6833 Body mass index (BMI) 33.0-33.9, adult: Secondary | ICD-10-CM

## 2015-02-10 DIAGNOSIS — Z3400 Encounter for supervision of normal first pregnancy, unspecified trimester: Secondary | ICD-10-CM

## 2015-02-10 DIAGNOSIS — Z833 Family history of diabetes mellitus: Secondary | ICD-10-CM

## 2015-02-10 DIAGNOSIS — O09891 Supervision of other high risk pregnancies, first trimester: Secondary | ICD-10-CM

## 2015-02-10 DIAGNOSIS — O359XX0 Maternal care for (suspected) fetal abnormality and damage, unspecified, not applicable or unspecified: Secondary | ICD-10-CM

## 2015-02-10 LAB — POCT GLUCOSE, URINE, QUALITATIVE, DIPSTICK: Glucose, UA: NEGATIVE

## 2015-02-10 LAB — POCT PROTEIN, URINE, QUALITATIVE, DIPSTICK: POCT Protein, UA: NEGATIVE mg/dL

## 2015-02-10 NOTE — Progress Notes (Signed)
Feeling well, no FM noted by pt  Reviewed prenatal testing, Quad and level 2 ordered  Reviewed warning sx, urgent care, PTL, PIH, labor sx, kick counts

## 2015-02-12 LAB — QUAD SCREEN M
Age Risk Down Syndrome: 1:1180 {titer}
Estriol, MoM: 1.27
Estriol, Unconjugated: 0.98 ng/mL
Gestational Age:: 16
Inhibin MOM: 0.61
Inhibin: 89 pg/mL
MSAFP MOM: 0.99
MSAFP: 25.5 ng/mL
MSS Down Syndrome Risk: <1:5000 {titer}
MSS Trisomy 18 Risk: <1:5000 {titer}
Number of Fetuses:: 1
Risk for ONTD: <1:5000 {titer}
WGHT: 196
hCG MOM: 1.04
hCG: 32.1

## 2015-03-04 ENCOUNTER — Ambulatory Visit: Payer: Self-pay | Attending: Family

## 2015-03-04 DIAGNOSIS — O359XX0 Maternal care for (suspected) fetal abnormality and damage, unspecified, not applicable or unspecified: Secondary | ICD-10-CM | POA: Insufficient documentation

## 2015-03-04 DIAGNOSIS — Z3401 Encounter for supervision of normal first pregnancy, first trimester: Secondary | ICD-10-CM

## 2015-03-10 ENCOUNTER — Ambulatory Visit (INDEPENDENT_AMBULATORY_CARE_PROVIDER_SITE_OTHER): Payer: Medicaid - Out of State | Admitting: Family

## 2015-03-10 ENCOUNTER — Encounter (INDEPENDENT_AMBULATORY_CARE_PROVIDER_SITE_OTHER): Payer: Self-pay | Admitting: Family

## 2015-03-10 VITALS — BP 119/69 | Wt 200.1 lb

## 2015-03-10 DIAGNOSIS — Z833 Family history of diabetes mellitus: Secondary | ICD-10-CM

## 2015-03-10 DIAGNOSIS — Z3400 Encounter for supervision of normal first pregnancy, unspecified trimester: Secondary | ICD-10-CM

## 2015-03-10 DIAGNOSIS — Z6833 Body mass index (BMI) 33.0-33.9, adult: Secondary | ICD-10-CM

## 2015-03-10 DIAGNOSIS — Z3401 Encounter for supervision of normal first pregnancy, first trimester: Secondary | ICD-10-CM

## 2015-03-10 DIAGNOSIS — O09891 Supervision of other high risk pregnancies, first trimester: Secondary | ICD-10-CM

## 2015-03-10 LAB — POCT GLUCOSE, URINE, QUALITATIVE, DIPSTICK: Glucose, UA: NEGATIVE

## 2015-03-10 LAB — POCT PROTEIN, URINE, QUALITATIVE, DIPSTICK

## 2015-03-10 NOTE — Progress Notes (Signed)
Feeling well, reports FM.  No major complaints today.     Sono on 10/04: "Prominent gallbladder, follow-up is recommended; no other fetal or maternal abnormalities detected."    Quad NL.  Urine cx negative.   Reviewed warning signs and urgent care.

## 2015-03-10 NOTE — Progress Notes (Signed)
Flu vaccine info sheet and flu voucher given to pt. Pt aware to present voucher ASAP  to closest HD location listed on voucher to receive free flu  vaccine.

## 2015-03-25 ENCOUNTER — Encounter (INDEPENDENT_AMBULATORY_CARE_PROVIDER_SITE_OTHER): Payer: Self-pay | Admitting: Obstetrics & Gynecology

## 2015-04-07 ENCOUNTER — Ambulatory Visit: Payer: Medicaid - Out of State | Attending: Family | Admitting: Advanced Practice Midwife

## 2015-04-07 DIAGNOSIS — Z3401 Encounter for supervision of normal first pregnancy, first trimester: Secondary | ICD-10-CM

## 2015-04-07 DIAGNOSIS — Z6833 Body mass index (BMI) 33.0-33.9, adult: Secondary | ICD-10-CM

## 2015-04-07 DIAGNOSIS — Z833 Family history of diabetes mellitus: Secondary | ICD-10-CM

## 2015-04-07 DIAGNOSIS — O09891 Supervision of other high risk pregnancies, first trimester: Secondary | ICD-10-CM

## 2015-04-07 LAB — POCT PROTEIN, URINE, QUALITATIVE, DIPSTICK

## 2015-04-07 LAB — POCT GLUCOSE, URINE, QUALITATIVE, DIPSTICK: Glucose, UA: NEGATIVE

## 2015-04-07 NOTE — Progress Notes (Signed)
Sono ordered for f/u fetal gallbladder  NV: 28 wk labs  Early DM screen wnl  Reviewed warning sx, urgent care, labor sx, kick counts

## 2015-04-08 ENCOUNTER — Observation Stay
Admission: RE | Admit: 2015-04-08 | Discharge: 2015-04-08 | Disposition: A | Discharge status: Home / Self Care | Payer: Medicaid - Out of State | Source: Ambulatory Visit | Attending: Obstetrics & Gynecology | Admitting: Obstetrics & Gynecology

## 2015-04-08 ENCOUNTER — Encounter (HOSPITAL_BASED_OUTPATIENT_CLINIC_OR_DEPARTMENT_OTHER): Payer: Self-pay

## 2015-04-08 ENCOUNTER — Observation Stay (HOSPITAL_BASED_OUTPATIENT_CLINIC_OR_DEPARTMENT_OTHER): Payer: Medicaid - Out of State

## 2015-04-08 ENCOUNTER — Emergency Department
Admission: EM | Admit: 2015-04-08 | Discharge: 2015-04-08 | Disposition: A | Discharge status: Still In Hospital | Payer: Self-pay

## 2015-04-08 DIAGNOSIS — O365920 Maternal care for other known or suspected poor fetal growth, second trimester, not applicable or unspecified: Principal | ICD-10-CM | POA: Insufficient documentation

## 2015-04-08 DIAGNOSIS — N949 Unspecified condition associated with female genital organs and menstrual cycle: Secondary | ICD-10-CM | POA: Diagnosis present

## 2015-04-08 DIAGNOSIS — Z3A24 24 weeks gestation of pregnancy: Secondary | ICD-10-CM | POA: Insufficient documentation

## 2015-04-08 DIAGNOSIS — R1030 Lower abdominal pain, unspecified: Secondary | ICD-10-CM | POA: Insufficient documentation

## 2015-04-08 LAB — URINALYSIS WITH MICROSCOPIC
Bilirubin, UA: NEGATIVE
Blood, UA: NEGATIVE
Glucose, UA: NEGATIVE
Ketones UA: NEGATIVE
Leukocyte Esterase, UA: NEGATIVE
Nitrite, UA: NEGATIVE
Protein, UR: NEGATIVE
Specific Gravity UA: 1.012 (ref 1.001–1.035)
Urine pH: 7.5 (ref 5.0–8.0)
Urobilinogen, UA: NORMAL mg/dL

## 2015-04-08 MED ORDER — ONDANSETRON HCL 4 MG/2ML IJ SOLN
4.0000 mg | Freq: Four times a day (QID) | INTRAMUSCULAR | Status: DC | PRN
Start: 2015-04-08 — End: 2015-04-08

## 2015-04-08 MED ORDER — ACETAMINOPHEN 500 MG PO TABS
ORAL_TABLET | ORAL | Status: AC
Start: 2015-04-08 — End: 2015-04-08
  Administered 2015-04-08: 1000 mg via ORAL
  Filled 2015-04-08: qty 2

## 2015-04-08 MED ORDER — ACETAMINOPHEN 500 MG PO TABS
1000.0000 mg | ORAL_TABLET | ORAL | Status: DC | PRN
Start: 2015-04-08 — End: 2015-04-08

## 2015-04-08 MED ORDER — ONDANSETRON 4 MG PO TBDP
4.0000 mg | ORAL_TABLET | Freq: Four times a day (QID) | ORAL | Status: DC | PRN
Start: 2015-04-08 — End: 2015-04-08

## 2015-04-08 NOTE — Progress Notes (Signed)
OBSTETRICS HISTORY & PHYSICAL    Date/Time: 04/08/2015, 5:36 PM  Patient Name: Julia [name]  Attending: [facility], Physician*    CC: ctx and lower abdominal pain     History of Present Illness:   Julia [name] is a 19 y.o. G1P0 at [redacted]w[redacted]d, EDD 07/28/2015, by Last Menstrual Period presenting to triage with lower abdominal pain and ctx. States pain started last night and progressively gotten worse. Denies trying any medications to help relieve pain. Denies VB, LOF, HA, vision changes, CP, SOB, N/V, RUQ/epigastric pain.    Pregnancy c/b:  -Fetal abnormality in pregnancy: prominent gallbladder on sono. To have f/u in 3rd trimester (12/7)  - Teen pregnancy: quad negative, level II sono wnl aside from prominent gallbladder.     Obstetrical History:   G1P0  Obstetric History    G1   P0   T0   P0   A0   TAB0   SAB0   E0   M0   L0       # Outcome Date GA Lbr Len/2nd Weight Sex Delivery Anes PTL Lv   1 Current                   Gynecological History:   Patient's last menstrual period was 10/21/2014 (within days).  No h/o pap smears due to age.   Denies h/o STIs    Past Medical History:   History reviewed. No pertinent past medical history.    Past Surgical History:   History reviewed. No pertinent past surgical history.    Medications:     Current Discharge Medication List      CONTINUE these medications which have NOT CHANGED    Details   Prenatal Multivit-Min-Fe-FA (PRENATAL VITAMINS PO) Take by mouth.              Allergies:   No Known Allergies    Social History:     Social History     Social History   . Marital Status: Significant Other     Spouse Name: Davina Poke   . Number of Children: 0   . Years of Education: 11th     Occupational History   . unemployed      Social History Main Topics   . Smoking status: Never Smoker    . Smokeless tobacco: Never Used   . Alcohol Use: No   . Drug Use: No   . Sexual Activity:     Partners: Male     Birth Control/ Protection: None      Comment: Referred to FP class     Other  Topics Concern   . Not on file     Social History Narrative    1. Have you ever been hit, kicked, slapped, pushed, shoved or threatened by your significant other (husband/ fiance/ boyfriend/ someone you live with)? Denies        2. Have you ever been raped or forced to engage in sexual activity that you did not want to take part in? Denies        3. Have you recently, or in the past, had any thoughts of hurting yourself or someone else? Denies        4. Have you ever been hospitalized in the past for any psychiatric reasons? Denies    5. On a scale of 1-5, how do you rate your current stress level (add #)? Denies stress          1  2        3         4         5          LOW                                 HIGH        6. Do you feel safe where you live? Feels safe    7. How many times have you moved in the last 12 months? once    8. If you could change the timing of this pregnancy, would you want it: No change           Earlier        Later        Not at all      No Change         FOB: involved and supportive        OTHER-      4 yrs in the [place]- since Dec 2012    [place]  born, lived [place] 15 [place]rs    11th grade in [place], not enrolled in school    Needs Spanish interpreter    JJofre             Denies tobacco, alcohol, or illicit drug use  FOB is involved, denies physical or sexual abuse    Family History:     Family History   Problem Relation Age of Onset   . Diabetes Father    . Cancer Neg Hx    . Hypertension Neg Hx    . Preterm labor Neg Hx      Denies fam hx of HTN, DM, asthma, MI, stroke  Denies fam hx of cervical, endometrial, ovarian, breast, colon cancer    Physical Exam:   BP 117/68 mmHg  Pulse 72  Temp(Src) 98.6 F (37 C) (Oral)  LMP 10/21/2014 (Within Days)  Gen: NAD, AOx3  CV: RRR, no M/R/G  Pulm: CTAB, no wheeze  Abd: Gravid, NT  LE: No LE edema  SSE: external os visually closed, no pooling or bleeding noted in vaginal vault, no lesions noted   SVE: closed/long/high    EFM: 150/mod/+acc/no  dec  Toco: acontractile     Labs & Imaging:     Results     ** No results found for the last 24 hours. **          Prenatal Labs:  Blood: O POS, ab neg  Rubella: immune  RPR: non-reactive  HBsAg: non-reactive  HIV: non-reactive  GC/CT: negative  TB: negative  PAP: not performed due to age   Glucola: wnl   GBS: unknown     No results found.    Bedside US: breech, placenta posterior, fluid subjectively adequate     Assessment & Plan:   19 y.o. G1P0 at [redacted]w[redacted]d presenting for r/o labor.   Rh+/RI/GBS?  - Likely round ligament pain since no ctx on toco, SVE long/closed/high.   - Category I tracing  - Toco acontractile   - Will get UA, give tylenol 1g and PO hydrate. Will reevaluate.  - If pain improves, will discharge home with labor precautions and kick counts. Advise to take Tylenol 1g PRN for pain      D/w Dr. Vania Rea, DO (PGY-1)

## 2015-04-08 NOTE — Discharge Instructions (Signed)
Llame a su doctor si:   Las contracciones se tornan mas fuertes y regulares, cada 5 minutos durante 1 hora   Se le rompe la bolsa de agua (un borboton repentino o una perdida continua)   Pierde sangre por la vagina (brillante, roja, que le corre entre las pierneas o presenta coagulos)   manchar un poco luego de un examen vaginal es normal   Siente un dolor abdominal inusualmente agudo   Nota una disminucion en el movimiento fetal (del bebe)   Tienes dolores de cabeza fuertes y persistentes   Se le nubla la vista o percibe manchas en su vision   Tiene nauseas y vomito   Se la hinchan repentinamente los pies, los manos, la cara y los tobillos   Tiene escalofrios y fiebre de mas de 100.4   Orina muy frecuentemente o le arde cuando orina  &   Tiene inquietudes o preguntas adicionales.   Cumpla con la que se le dio previamente para su proxima visita al doctor   Se le dio de alto a la casa desde triaje.

## 2015-04-08 NOTE — Progress Notes (Signed)
Discharged to home per Dr Windle Guard.  Instructions given.

## 2015-05-07 ENCOUNTER — Ambulatory Visit (INDEPENDENT_AMBULATORY_CARE_PROVIDER_SITE_OTHER): Payer: Medicaid Other | Admitting: Family

## 2015-05-07 ENCOUNTER — Encounter (INDEPENDENT_AMBULATORY_CARE_PROVIDER_SITE_OTHER): Payer: Self-pay | Admitting: Family

## 2015-05-07 ENCOUNTER — Ambulatory Visit: Payer: Medicaid Other | Attending: Advanced Practice Midwife

## 2015-05-07 DIAGNOSIS — O9989 Other specified diseases and conditions complicating pregnancy, childbirth and the puerperium: Secondary | ICD-10-CM

## 2015-05-07 DIAGNOSIS — Z833 Family history of diabetes mellitus: Secondary | ICD-10-CM | POA: Insufficient documentation

## 2015-05-07 DIAGNOSIS — Z6833 Body mass index (BMI) 33.0-33.9, adult: Secondary | ICD-10-CM

## 2015-05-07 DIAGNOSIS — O09891 Supervision of other high risk pregnancies, first trimester: Secondary | ICD-10-CM

## 2015-05-07 DIAGNOSIS — Z3401 Encounter for supervision of normal first pregnancy, first trimester: Secondary | ICD-10-CM | POA: Insufficient documentation

## 2015-05-07 LAB — CBC
Hematocrit: 37.1 % (ref 37.0–47.0)
Hgb: 12.6 g/dL (ref 12.0–16.0)
MCH: 32.9 pg — ABNORMAL HIGH (ref 28.0–32.0)
MCHC: 34 g/dL (ref 32.0–36.0)
MCV: 96.9 fL (ref 80.0–100.0)
MPV: 11.1 fL (ref 9.4–12.3)
Nucleated RBC: 0 /100 WBC (ref 0–1)
Platelets: 203 10*3/uL (ref 140–400)
RBC: 3.83 10*6/uL — ABNORMAL LOW (ref 4.20–5.40)
RDW: 12 % (ref 12–15)
WBC: 11.79 10*3/uL — ABNORMAL HIGH (ref 3.50–10.80)

## 2015-05-07 LAB — POCT PROTEIN, URINE, QUALITATIVE, DIPSTICK: POCT Protein, UA: NEGATIVE mg/dL

## 2015-05-07 LAB — POCT GLUCOSE, URINE, QUALITATIVE, DIPSTICK: Glucose, UA: NEGATIVE

## 2015-05-07 LAB — GLUCOSE CHALLENGE: Glucose Challenge: 104 mg/dL

## 2015-05-07 NOTE — Progress Notes (Signed)
Feeling well. Fetus Active  Sono today, final report pending  Went to ED on 04/08/2015 for ctx, r/o preterm, c/w round ligament, none since then.   28wk labs ordered  Reviewed warning sx, urgent care, PTL, PIH, labor sx, kick counts.

## 2015-05-12 MED ORDER — PRENATAL VITAMINS 0.8 MG PO TABS
1.0000 | ORAL_TABLET | Freq: Every day | ORAL | Status: AC
Start: 2015-05-12 — End: ?

## 2015-05-12 NOTE — Addendum Note (Signed)
Addended by: Jiles Crocker on: 05/12/2015 11:45 AM     Modules accepted: Orders

## 2015-05-13 ENCOUNTER — Encounter (INDEPENDENT_AMBULATORY_CARE_PROVIDER_SITE_OTHER): Payer: Self-pay | Admitting: Obstetrics & Gynecology

## 2015-05-21 ENCOUNTER — Encounter (INDEPENDENT_AMBULATORY_CARE_PROVIDER_SITE_OTHER): Payer: Self-pay | Admitting: Family

## 2015-05-21 ENCOUNTER — Ambulatory Visit (INDEPENDENT_AMBULATORY_CARE_PROVIDER_SITE_OTHER): Payer: Medicaid Other | Admitting: Family

## 2015-05-21 VITALS — BP 119/68 | Wt 211.8 lb

## 2015-05-21 DIAGNOSIS — Z23 Encounter for immunization: Secondary | ICD-10-CM

## 2015-05-21 DIAGNOSIS — Z833 Family history of diabetes mellitus: Secondary | ICD-10-CM

## 2015-05-21 DIAGNOSIS — O09891 Supervision of other high risk pregnancies, first trimester: Secondary | ICD-10-CM

## 2015-05-21 DIAGNOSIS — Z6833 Body mass index (BMI) 33.0-33.9, adult: Secondary | ICD-10-CM

## 2015-05-21 DIAGNOSIS — Z3401 Encounter for supervision of normal first pregnancy, first trimester: Secondary | ICD-10-CM

## 2015-05-21 DIAGNOSIS — Z3009 Encounter for other general counseling and advice on contraception: Secondary | ICD-10-CM

## 2015-05-21 LAB — POCT GLUCOSE, URINE, QUALITATIVE, DIPSTICK: Glucose, UA: NEGATIVE

## 2015-05-21 LAB — POCT PROTEIN, URINE, QUALITATIVE, DIPSTICK: POCT Protein, UA: NEGATIVE mg/dL

## 2015-05-21 NOTE — Progress Notes (Signed)
Administered TDAP, supplied by [facility] at no charge.  Tolerated well.  Kamyah Wilhelmsen Melton Alar

## 2015-05-21 NOTE — Progress Notes (Signed)
No major complaints   sono wnl   Tdap given

## 2015-06-01 NOTE — L&D Delivery Note (Signed)
VAGINAL DELIVERY NOTE    DELIVERY DATE/TIME: 07/30/2015 @ 10:32AM    DELIVERY PERFORMED BY: Alroy Bailiff, DO (PGY-1), Yvette Rack, MD (PGY-1), Ralene Ok CNM  OTHER PHYSICIANS PRESENT:     Mother: 20 y.o. G1P0, GBS Status: neg  Anesthesia: none  Infant: Gender: female  Weight: 7 lb 6.5 oz (3360 g)    APGAR: 8  (1 min), 9  (5 min)     Called to room for increased pelvic pressure and patient desiring to push. Patient was fully dilated and pushed for 10 minutes. NSVD of viable infant over intact perineum. ROA position. No nuchal cord. No meconium. Head was delivered in a controlled fashion and anterior shoulder followed atraumatically. Neonate was placed on the pt's abdomen. Tactile stimulation performed. Cord clamped and cut. Neonate handed to nursing staff. Intact placenta w/ normal 3-vessel cord delivered via gentle traction and suprapubic pressure. Pitocin then given to facilitate uterine contractions. Perineum explored, vaginal abrasions, no lacerations noted. Excellent hemostasis noted. Counts correct x 2. Fundus was palpated and noted to be firm after delivery.    EBL: 300 cc  Complications/Lacerations: None  Condition: Mother and infant stable postpartum    Yvette Rack, MD (PGY-1)    I was present and have reviewed the notes, assessments, and/or procedures performed by Dr. Yvette Rack, PGY1; I concur with her documentation of [name], Julia's vaginal delivery. Dr. Marjie Skiff present on the L&D unit and available for immediate assistance if needed.    Ralene Ok, CNM

## 2015-06-04 ENCOUNTER — Ambulatory Visit: Payer: Medicaid - Out of State | Attending: Family | Admitting: Family

## 2015-06-04 VITALS — BP 113/69 | Wt 211.7 lb

## 2015-06-04 DIAGNOSIS — Z3401 Encounter for supervision of normal first pregnancy, first trimester: Secondary | ICD-10-CM

## 2015-06-04 DIAGNOSIS — Z3009 Encounter for other general counseling and advice on contraception: Secondary | ICD-10-CM

## 2015-06-04 DIAGNOSIS — Z833 Family history of diabetes mellitus: Secondary | ICD-10-CM

## 2015-06-04 DIAGNOSIS — O09891 Supervision of other high risk pregnancies, first trimester: Secondary | ICD-10-CM

## 2015-06-04 DIAGNOSIS — Z6833 Body mass index (BMI) 33.0-33.9, adult: Secondary | ICD-10-CM

## 2015-06-04 LAB — POCT GLUCOSE, URINE, QUALITATIVE, DIPSTICK: Glucose, UA: NEGATIVE

## 2015-06-04 LAB — POCT PROTEIN, URINE, QUALITATIVE, DIPSTICK

## 2015-06-04 NOTE — Progress Notes (Signed)
Feeling well, fetus active, denies problems  Reviewed breast feeding  FP: Depo  Reviewed warning sx, urgent care, PTL, PIH, labor sx, kick counts

## 2015-06-18 ENCOUNTER — Ambulatory Visit: Payer: Self-pay | Attending: Advanced Practice Midwife | Admitting: Family

## 2015-06-18 VITALS — BP 124/64 | Wt 216.5 lb

## 2015-06-18 DIAGNOSIS — O09891 Supervision of other high risk pregnancies, first trimester: Secondary | ICD-10-CM

## 2015-06-18 DIAGNOSIS — Z6833 Body mass index (BMI) 33.0-33.9, adult: Secondary | ICD-10-CM

## 2015-06-18 DIAGNOSIS — Z3401 Encounter for supervision of normal first pregnancy, first trimester: Secondary | ICD-10-CM

## 2015-06-18 DIAGNOSIS — Z3009 Encounter for other general counseling and advice on contraception: Secondary | ICD-10-CM

## 2015-06-18 DIAGNOSIS — Z833 Family history of diabetes mellitus: Secondary | ICD-10-CM

## 2015-06-18 DIAGNOSIS — Z3403 Encounter for supervision of normal first pregnancy, third trimester: Secondary | ICD-10-CM | POA: Insufficient documentation

## 2015-06-18 LAB — POCT GLUCOSE, URINE, QUALITATIVE, DIPSTICK: Glucose, UA: NEGATIVE

## 2015-06-18 LAB — POCT PROTEIN, URINE, QUALITATIVE, DIPSTICK

## 2015-06-18 NOTE — Progress Notes (Signed)
Feeling well, fetus active  GBS at next visit  Reviewed warning sx, urgent care, PTL, PIH, labor sx, kick counts

## 2015-06-30 ENCOUNTER — Observation Stay (HOSPITAL_BASED_OUTPATIENT_CLINIC_OR_DEPARTMENT_OTHER): Payer: Medicaid - Out of State

## 2015-06-30 ENCOUNTER — Observation Stay (HOSPITAL_BASED_OUTPATIENT_CLINIC_OR_DEPARTMENT_OTHER): Admission: AD | Admit: 2015-06-30 | Payer: Medicaid - Out of State | Source: Home / Self Care

## 2015-06-30 ENCOUNTER — Observation Stay
Admission: AD | Admit: 2015-06-30 | Discharge: 2015-06-30 | Disposition: A | Discharge status: Home / Self Care | Payer: Medicaid Other | Attending: Obstetrics & Gynecology | Admitting: Obstetrics & Gynecology

## 2015-06-30 DIAGNOSIS — O26893 Other specified pregnancy related conditions, third trimester: Principal | ICD-10-CM | POA: Insufficient documentation

## 2015-06-30 DIAGNOSIS — R1032 Left lower quadrant pain: Secondary | ICD-10-CM | POA: Insufficient documentation

## 2015-06-30 DIAGNOSIS — R1031 Right lower quadrant pain: Secondary | ICD-10-CM | POA: Insufficient documentation

## 2015-06-30 DIAGNOSIS — Z3A36 36 weeks gestation of pregnancy: Secondary | ICD-10-CM

## 2015-06-30 LAB — CBC
Hematocrit: 37.1 % (ref 37.0–47.0)
Hgb: 12.8 g/dL (ref 12.0–16.0)
MCH: 31.8 pg (ref 28.0–32.0)
MCHC: 34.5 g/dL (ref 32.0–36.0)
MCV: 92.1 fL (ref 80.0–100.0)
MPV: 10.5 fL (ref 9.4–12.3)
Nucleated RBC: 0 /100 WBC (ref 0–1)
Platelets: 205 10*3/uL (ref 140–400)
RBC: 4.03 10*6/uL — ABNORMAL LOW (ref 4.20–5.40)
RDW: 12 % (ref 12–15)
WBC: 11.14 10*3/uL — ABNORMAL HIGH (ref 3.50–10.80)

## 2015-06-30 LAB — COMPREHENSIVE METABOLIC PANEL
ALT: 13 U/L (ref 0–55)
AST (SGOT): 16 U/L (ref 5–34)
Albumin/Globulin Ratio: 0.9 (ref 0.9–2.2)
Albumin: 2.9 g/dL — ABNORMAL LOW (ref 3.5–5.0)
Alkaline Phosphatase: 148 U/L — ABNORMAL HIGH (ref 37–106)
BUN: 9 mg/dL (ref 7.0–19.0)
Bilirubin, Total: 0.3 mg/dL (ref 0.2–1.2)
CO2: 21 mEq/L — ABNORMAL LOW (ref 22–29)
Calcium: 8.9 mg/dL (ref 8.5–10.5)
Chloride: 107 mEq/L (ref 100–111)
Creatinine: 0.5 mg/dL — ABNORMAL LOW (ref 0.6–1.0)
Globulin: 3.2 g/dL (ref 2.0–3.6)
Glucose: 85 mg/dL (ref 70–100)
Potassium: 4 mEq/L (ref 3.5–5.1)
Protein, Total: 6.1 g/dL (ref 6.0–8.3)
Sodium: 135 mEq/L — ABNORMAL LOW (ref 136–145)

## 2015-06-30 LAB — URINALYSIS WITH MICROSCOPIC
Bilirubin, UA: NEGATIVE
Blood, UA: NEGATIVE
Glucose, UA: NEGATIVE
Ketones UA: NEGATIVE
Nitrite, UA: NEGATIVE
Protein, UR: NEGATIVE
Specific Gravity UA: 1.015 (ref 1.001–1.035)
Urine pH: 6 (ref 5.0–8.0)
Urobilinogen, UA: NORMAL mg/dL

## 2015-06-30 LAB — PROTEIN / CREATININE RATIO, URINE
Urine Creatinine, Random: 55.2
Urine Protein Random: 8.8 mg/dL (ref 1.0–14.0)
Urine Protein/Creatinine Ratio: 0.2

## 2015-06-30 LAB — GFR: EGFR: >60

## 2015-06-30 LAB — LACTATE DEHYDROGENASE: LDH: 206 U/L (ref 125–331)

## 2015-06-30 LAB — URIC ACID: Uric acid: 3.9 mg/dL (ref 2.6–6.0)

## 2015-06-30 MED ORDER — ONDANSETRON HCL 4 MG/2ML IJ SOLN
4.0000 mg | Freq: Four times a day (QID) | INTRAMUSCULAR | Status: DC | PRN
Start: 2015-06-30 — End: 2015-06-30

## 2015-06-30 MED ORDER — ONDANSETRON 4 MG PO TBDP
4.0000 mg | ORAL_TABLET | Freq: Four times a day (QID) | ORAL | Status: DC | PRN
Start: 2015-06-30 — End: 2015-06-30

## 2015-06-30 MED ORDER — ACETAMINOPHEN 500 MG PO TABS
1000.0000 mg | ORAL_TABLET | ORAL | Status: DC | PRN
Start: 2015-06-30 — End: 2015-06-30
  Administered 2015-06-30: 1000 mg via ORAL
  Filled 2015-06-30 (×2): qty 2

## 2015-06-30 NOTE — Discharge Instructions (Signed)
Kick Counts  It's normal to worry about your baby's health. One way you can knowyour baby's doing well is to record the baby's movements once a day. This is called a kick count. You will usually feel your baby move by the 20th week of pregnancy. Remember to take your kick count records to all your appointments with your health care provider.    How to Count Kicks   Choose a time when the baby is active, such as after a meal.   Sit comfortably or lie on your side.   The first time the baby moves,write downthe time.   Count each movement until the baby has moved10times. This can take from 20 minutes to 2hours.   If the baby hasn't moved 4times in 1 hour,pat your stomach to wake the baby up. Eat or drink something, and if the baby still does not move, call your health care provider.   Try to do it at the same time each day.  When to Call Your Healthcare Provider  Call your healthcare providerright awayif you notice any of the following:  1. Your baby moves fewer than 10times in2hours while you're doing kick counts.  2. Your baby moves much less often than on thedays before.  3. You have not felt your baby move all day.   [address] [employer] [address], [place], [place] [address]. All rights reserved. This information is not intended as a substitute for professional medical care. Always follow your healthcare professional's instructions.    Premature Labor    Premature Labor (also called "Pre-term labor") is when you start to have symptoms of labor before 37 weeks of pregnancy (3 weeks before your due date). Premature labor can lead to premature delivery. Babies need at least 37 weeks of pregnancy for all the organs to develop normally. The earlier the delivery, the greater the danger to the baby.  In most cases, the actual cause of pre-term labor is unknown. However if you have any of the following conditions, you are at a higher risk:   Smoking   Alcohol or substance  abuse   High blood pressure   Hyperthyroidism   Diabetes   Premature labor with other pregnancies   Early rupture of the amniotic sac membranes   Infection in the uterus   "Incompetent" cervix   Twin pregnancy (or more)  Contractions are the first sign of premature labor. Contractions are different from cramping. Usually, a contraction is painful and the abdomen gets hard. It can last from a few seconds to a few minutes. Some women may feel only a sense of pressure in the abdomen, thighs, rectum or vagina. Some may feel only the hardening of the uterus without pain or pressure. Or, there may be a constant pain the lower back which spreads forward toward the abdomen.  Premature labor can be treated with medicines and activity restrictions. If the condition is treated early on, your baby will have a much better chance of avoiding a premature delivery.    HOME CARE:  4. Learn the signs of premature labor.  5. Don't smoke, drink alcohol or use other harmful substances.  6. Avoid stress and strenuous work.  7. Report any unusual symptoms to your doctor.  Follow Up  with your doctor or as advised by our staff.  Get Prompt Medical Attention  if any of the following occur:   Four or more contractions in a one-hour period   Gush or slow leaking of  water from your vagina   Any vaginal bleeding   Decreased movement of your baby   Change in vaginal discharge   Something seems wrong, even if you don't know what it is   2000-2015 [employer] [address] [place], [place] [address]. All rights reserved. This information is not intended as a substitute for professional medical care. Always follow your healthcare professional's instructions.

## 2015-06-30 NOTE — Progress Notes (Signed)
Discharge instructions reviewed with patient. Reinforced teaching of fetal kick counts and recognizing signs and symptoms of preterm labor. Patient verbalized understanding. Ambulatory upon discharge.

## 2015-06-30 NOTE — Progress Notes (Signed)
Reevaluate patient :     Patient see at the bedside, doing well, feel comfortable, pain much improved. Tolerate PO hydration    Filed Vitals:    06/30/15 1900   BP: 120/82   Pulse: 91   Temp:    Resp:      FHR: 140s,moderate var, ace+,dec-  Toco: no CTX    Results     Procedure Component Value Units Date/Time    Lactate dehydrogenase [[id number]] Collected:  06/30/15 1844    Specimen Information:  Blood Updated:  06/30/15 1931     LDH 206 U/L     Uric acid [[id number]] Collected:  06/30/15 1844    Specimen Information:  Blood Updated:  06/30/15 1931     Uric acid 3.9 mg/dL     GFR [[id number]] Collected:  06/30/15 1844     EGFR >60.0 Updated:  06/30/15 1931    Comprehensive metabolic panel [[id number]]  (Abnormal) Collected:  06/30/15 1844    Specimen Information:  Blood Updated:  06/30/15 1931     Glucose 85 mg/dL      BUN 9.0 mg/dL      Creatinine 0.5 (L) mg/dL      Sodium 742 (L) mEq/L      Potassium 4.0 mEq/L      Chloride 107 mEq/L      CO2 21 (L) mEq/L      Calcium 8.9 mg/dL      Protein, Total 6.1 g/dL      Albumin 2.9 (L) g/dL      AST (SGOT) 16 U/L      ALT 13 U/L      Alkaline Phosphatase 148 (H) U/L      Bilirubin, Total 0.3 mg/dL      Globulin 3.2 g/dL      Albumin/Globulin Ratio 0.9     Urinalysis with microscopic [[id number]]  (Abnormal) Collected:  06/30/15 1845    Specimen Information:  Urine Updated:  06/30/15 1917     Urine Type Clean Catch      Color, UA Yellow      Clarity, UA Clear      Specific Gravity UA 1.015      Urine pH 6.0      Leukocyte Esterase, UA Small (A)      Nitrite, UA Negative      Protein, UR Negative      Glucose, UA Negative      Ketones UA Negative      Urobilinogen, UA Normal mg/dL      Bilirubin, UA Negative      Blood, UA Negative      RBC, UA 0 - 5 /hpf      WBC, UA 0 - 5 /hpf      Squamous Epithelial Cells, Urine 0 - 5 /hpf     CBC without differential [[id number]]  (Abnormal) Collected:  06/30/15 1844    Specimen Information:  Blood from Blood Updated:  06/30/15 1903     WBC  11.14 (H) x10 3/uL      Hgb 12.8 g/dL      Hematocrit 41.6 %      Platelets 205 x10 3/uL      RBC 4.03 (L) x10 6/uL      MCV 92.1 fL      MCH 31.8 pg      MCHC 34.5 g/dL      RDW 12 %      MPV 10.5 fL      Nucleated RBC 0 /100  WBC     Urine culture [[id number]] Collected:  06/30/15 1845    Specimen Information:  Urine from Urine, Clean Catch Updated:  06/30/15 1845    Strep B screen (Group B Strep Culture Only) [[id number]] Collected:  06/30/15 1845    Specimen Information:  Vaginal/Anal Swabs Updated:  06/30/15 1845    Protein / creatinine ratio, urine [[id number]] Collected:  06/30/15 1844    Specimen Information:  Urine Updated:  06/30/15 1845          Patient will be discharged by now, will f/u in clinic late this week.   Labor and fetal kick count precaution     Noelle Penner MD PGY-4

## 2015-06-30 NOTE — H&P (Signed)
OBSTETRICS HISTORY & PHYSICAL    Date/Time: 06/30/2015, 5:52 PM  Patient Name: Julia [name]  Attending: [facility], Physician*    CC: lower abdominal pain, n/v, diarrhea    History of Present Illness:   Dezarae Mcclaran is a 20 y.o. G1P0 at [redacted]w[redacted]d, EDD 07/28/2015, by Last Menstrual Period presenting to triage with lower abdominal pain, HA, n/v, diarrhea x 2 days and ??visual changes. Denies sick contacts or change in diet. Denies VB, LOF, CTX, CP, SOB, RUQ/epigastric pain, dysuria, hematuria.     Pregnancy c/b:  -Fetal abnormality in pregnancy: prominent gallbladder on sono. F/u sono on 12/7 showed unremarkable gallbladder. Mild left fetal pelviectasis measuring 5mm. Otherwise no fetal or maternal abnormality.   -Teen pregnancy: quad negative, level II sono wnl aside from prominent gallbladder which resolved.     Obstetrical History:   G1P0  Obstetric History    G1   P0   T0   P0   A0   TAB0   SAB0   E0   M0   L0       # Outcome Date GA Lbr Len/2nd Weight Sex Delivery Anes PTL Lv   1 Current                   Gynecological History:   Patient's last menstrual period was 10/21/2014 (within days).  No h/o pap smears due to age.   Denies h/o abnormal PAP or STIs    Past Medical History:   No past medical history on file.    Past Surgical History:   No past surgical history on file.    Medications:     Current Discharge Medication List      CONTINUE these medications which have NOT CHANGED    Details   Prenatal Multivit-Min-Fe-FA (PRENATAL VITAMINS) 0.8 MG tablet Take 1 tablet by mouth daily.  Qty: 100 tablet, Refills: 0    Associated Diagnoses: Prenatal care, first pregnancy, first trimester              Allergies:   No Known Allergies    Social History:     Social History     Social History   . Marital Status: Significant Other     Spouse Name: Davina Poke   . Number of Children: 0   . Years of Education: 11th     Occupational History   . unemployed      Social History Main Topics   . Smoking status: Never Smoker     . Smokeless tobacco: Never Used   . Alcohol Use: No   . Drug Use: No   . Sexual Activity:     Partners: Male     Birth Control/ Protection: None      Comment: Referred to FP class     Other Topics Concern   . Not on file     Social History Narrative    1. Have you ever been hit, kicked, slapped, pushed, shoved or threatened by your significant other (husband/ fiance/ boyfriend/ someone you live with)? Denies        2. Have you ever been raped or forced to engage in sexual activity that you did not want to take part in? Denies        3. Have you recently, or in the past, had any thoughts of hurting yourself or someone else? Denies        4. Have you ever been hospitalized in the past for any psychiatric reasons? Denies  5. On a scale of 1-5, how do you rate your current stress level (add #)? Denies stress          1        2        3         4         5          LOW                                 HIGH        6. Do you feel safe where you live? Feels safe    7. How many times have you moved in the last 12 months? once    8. If you could change the timing of this pregnancy, would you want it: No change           Earlier        Later        Not at all      No Change         FOB: involved and supportive        OTHER-      4 yrs in the [place]- since Dec 2012    [place]  born, lived [place]rs    11th grade in [place], not enrolled in school    Needs Spanish interpreter    JJofre             Denies tobacco, alcohol, or illicit drug use  FOB is involved, denies physical or sexual abuse    Family History:     Family History   Problem Relation Age of Onset   . Diabetes Father    . Cancer Neg Hx    . Hypertension Neg Hx    . Preterm labor Neg Hx      Denies fam hx of HTN, DM, asthma, MI, stroke  Denies fam hx of cervical, endometrial, ovarian, breast, colon cancer    Physical Exam:   LMP 10/21/2014 (Within Days)  Gen: NAD, AOx3  CV: RRR, no M/R/G  Pulm: CTAB, no wheeze  Abd: Gravid, NT, EFW 6lb by Leopolds  LE: No LE  edema  SSE: deferred  SVE: 1/30/-3    EFM: 130/mod/+acc/no dec  Toco: no ctx      Labs & Imaging:     Results     ** No results found for the last 24 hours. **          Prenatal Labs:  Blood: O POS, ab neg  Rubella: immune  RPR: non-reactive  HBsAg: non-reactive  HIV: non-reactive  GC/CT: negative  TB: negative  PAP: NILM  Glucola: 104   GBS: unknown     No results found.    Bedside US: vertex     Assessment & Plan:   20 y.o. G1P0 at [redacted]w[redacted]d presenting with lower abdominal pain   Rh+/RI/GBS?  - NST reactive  - Toco: no ctx  - SVE 1/30/-3  - CBC, CMP  - BP 120s/80s. PIH labs ordered  - UA, UCx collected  - PO hydration  - zofran     D/w Dr. Velna Ochs, DO (PGY-1)

## 2015-07-02 ENCOUNTER — Ambulatory Visit (INDEPENDENT_AMBULATORY_CARE_PROVIDER_SITE_OTHER): Payer: Medicaid - Out of State | Admitting: Family

## 2015-07-02 LAB — GROUP B STREP TRANSCRIBED: GBS Transcribed: NEGATIVE

## 2015-07-02 NOTE — UM Notes (Signed)
This patient discharged from our facility on 06/30/15.

## 2015-07-03 NOTE — UM Notes (Signed)
Pastient discharged 1.30.17

## 2015-07-08 ENCOUNTER — Ambulatory Visit: Payer: Self-pay | Attending: Family | Admitting: Family

## 2015-07-08 DIAGNOSIS — Z833 Family history of diabetes mellitus: Secondary | ICD-10-CM

## 2015-07-08 DIAGNOSIS — Z3401 Encounter for supervision of normal first pregnancy, first trimester: Secondary | ICD-10-CM

## 2015-07-08 DIAGNOSIS — O09891 Supervision of other high risk pregnancies, first trimester: Secondary | ICD-10-CM

## 2015-07-08 DIAGNOSIS — Z6833 Body mass index (BMI) 33.0-33.9, adult: Secondary | ICD-10-CM

## 2015-07-08 LAB — POCT GLUCOSE, URINE, QUALITATIVE, DIPSTICK: Glucose, UA: NEGATIVE

## 2015-07-08 LAB — POCT PROTEIN, URINE, QUALITATIVE, DIPSTICK: POCT Protein, UA: NEGATIVE mg/dL

## 2015-07-08 NOTE — Progress Notes (Signed)
Feeling well. Fetus Active  Went to H for abdominal pain, n/v, none since   Denies PIH s/sx. Strong precautions given   GBS neg   Reviewed warning sx, urgent care, PTL, PIH, labor sx, kick counts.

## 2015-07-15 ENCOUNTER — Ambulatory Visit (INDEPENDENT_AMBULATORY_CARE_PROVIDER_SITE_OTHER): Payer: Self-pay | Admitting: Obstetrics & Gynecology

## 2015-07-15 VITALS — BP 121/72 | Wt 223.5 lb

## 2015-07-15 DIAGNOSIS — Z3A38 38 weeks gestation of pregnancy: Secondary | ICD-10-CM

## 2015-07-15 DIAGNOSIS — Z3403 Encounter for supervision of normal first pregnancy, third trimester: Secondary | ICD-10-CM

## 2015-07-15 DIAGNOSIS — O480 Post-term pregnancy: Secondary | ICD-10-CM

## 2015-07-15 NOTE — Progress Notes (Signed)
No c/o  D/w pt if goes past 40 weeks will IOL, order for IOL at 40 weeks placed  Reviewed s/sx labor  rto 1 week

## 2015-07-16 ENCOUNTER — Encounter (INDEPENDENT_AMBULATORY_CARE_PROVIDER_SITE_OTHER): Payer: Self-pay

## 2015-07-22 ENCOUNTER — Ambulatory Visit (INDEPENDENT_AMBULATORY_CARE_PROVIDER_SITE_OTHER): Payer: Self-pay | Admitting: Family

## 2015-07-22 VITALS — BP 128/75 | Wt 229.9 lb

## 2015-07-22 DIAGNOSIS — Z833 Family history of diabetes mellitus: Secondary | ICD-10-CM

## 2015-07-22 DIAGNOSIS — Z6833 Body mass index (BMI) 33.0-33.9, adult: Secondary | ICD-10-CM

## 2015-07-22 DIAGNOSIS — O09891 Supervision of other high risk pregnancies, first trimester: Secondary | ICD-10-CM

## 2015-07-22 DIAGNOSIS — Z3401 Encounter for supervision of normal first pregnancy, first trimester: Secondary | ICD-10-CM

## 2015-07-22 DIAGNOSIS — Z3403 Encounter for supervision of normal first pregnancy, third trimester: Secondary | ICD-10-CM

## 2015-07-22 LAB — POCT PROTEIN, URINE, QUALITATIVE, DIPSTICK

## 2015-07-22 LAB — POCT GLUCOSE, URINE, QUALITATIVE, DIPSTICK: Glucose, UA: NEGATIVE

## 2015-07-22 NOTE — Progress Notes (Signed)
Patient given information letter for scheduled Induction on 07-29-15 @ 9:30 pm at [facility]. Confirmed with patient date/ time to arrive at hospital and to call the on call provider or seek urgent medical care if signs or symptoms of labor occur, if fetal movement less than 10/24 hours, or if any other problems develop earlier .Patient was reminded to schedule her 6 week post partum visit appointment. Patient verbalized understanding.  Donn Pierini Corley Kohls,RN

## 2015-07-22 NOTE — Progress Notes (Signed)
Feeling well, fetus active.  Reports mild irregular contractions.   IOL scheduled on 02/28.   IOL letter and teaching given to pt by the nurse today.  Stressed the importance of daily kick counts.   Reviewed warning signs, urgent care, PIH, labor sxs and kick counts.

## 2015-07-24 ENCOUNTER — Encounter (HOSPITAL_BASED_OUTPATIENT_CLINIC_OR_DEPARTMENT_OTHER): Payer: Self-pay

## 2015-07-24 ENCOUNTER — Observation Stay
Admission: RE | Admit: 2015-07-24 | Discharge: 2015-07-24 | Disposition: A | Discharge status: Home / Self Care | Payer: Medicaid Other | Source: Ambulatory Visit | Attending: Obstetrics & Gynecology | Admitting: Obstetrics & Gynecology

## 2015-07-24 ENCOUNTER — Observation Stay (HOSPITAL_BASED_OUTPATIENT_CLINIC_OR_DEPARTMENT_OTHER): Payer: Self-pay | Admitting: Obstetrics & Gynecology

## 2015-07-24 DIAGNOSIS — R1030 Lower abdominal pain, unspecified: Secondary | ICD-10-CM | POA: Insufficient documentation

## 2015-07-24 DIAGNOSIS — R109 Unspecified abdominal pain: Secondary | ICD-10-CM | POA: Diagnosis present

## 2015-07-24 DIAGNOSIS — Z3A39 39 weeks gestation of pregnancy: Secondary | ICD-10-CM | POA: Insufficient documentation

## 2015-07-24 DIAGNOSIS — O26893 Other specified pregnancy related conditions, third trimester: Principal | ICD-10-CM | POA: Insufficient documentation

## 2015-07-24 LAB — HAS ZIKA TESTING BEEN PERFORMED

## 2015-07-24 NOTE — Discharge Instructions (Signed)
Llame a su doctor si:   Las contracciones se tornan mas fuertes y regulares, cada 5 minutos durante 1 hora   Se le rompe la bolsa de agua (un borboton repentino o una perdida continua)   Pierde sangre por la vagina (brillante, roja, que le corre entre las pierneas o presenta coagulos)   manchar un poco luego de un examen vaginal es normal   Siente un dolor abdominal inusualmente agudo   Nota una disminucion en el movimiento fetal (del bebe)   Tienes dolores de cabeza fuertes y persistentes   Se le nubla la vista o percibe manchas en su vision   Tiene nauseas y vomito   Se la hinchan repentinamente los pies, los manos, la cara y los tobillos   Tiene escalofrios y fiebre de mas de 100.4   Orina muy frecuentemente o le arde cuando orina  &   Tiene inquietudes o preguntas adicionales.   Cumpla con la que se le dio previamente para su proxima visita al doctor   Se le dio de alto a la casa desde triaje.

## 2015-07-24 NOTE — OB ED Provider Note (Signed)
Julia [name] is a 20 y.o. female at [redacted]w[redacted]d presents with complaints of lower abdominal pain.   Pain is intermittent and " her stomach gets hard"   No9 vaginal bleeding nor leakage of fluid.   Affirms good fetal movement.    Abd: soft, non tender and gravid   NST reactive   + ctx   SVE2/50/-3 - no change on re-exam     Not in labor at this time.   Labor instructions given.   Warning signs and follow up instructions given.     N.Samin Milke MD  [facility] ED Attending  EXt: [phone]

## 2015-07-29 ENCOUNTER — Encounter (HOSPITAL_BASED_OUTPATIENT_CLINIC_OR_DEPARTMENT_OTHER): Payer: Self-pay

## 2015-07-29 ENCOUNTER — Observation Stay (HOSPITAL_BASED_OUTPATIENT_CLINIC_OR_DEPARTMENT_OTHER): Payer: Medicaid Other

## 2015-07-29 ENCOUNTER — Inpatient Hospital Stay (HOSPITAL_BASED_OUTPATIENT_CLINIC_OR_DEPARTMENT_OTHER): Payer: Medicaid Other

## 2015-07-29 ENCOUNTER — Inpatient Hospital Stay
Admission: RE | Admit: 2015-07-29 | Discharge: 2015-07-31 | DRG: 560 | Disposition: A | Discharge status: Home / Self Care | Payer: Medicaid Other | Source: Ambulatory Visit | Attending: Obstetrics & Gynecology | Admitting: Obstetrics & Gynecology

## 2015-07-29 DIAGNOSIS — Z3400 Encounter for supervision of normal first pregnancy, unspecified trimester: Secondary | ICD-10-CM

## 2015-07-29 DIAGNOSIS — Z3A40 40 weeks gestation of pregnancy: Secondary | ICD-10-CM

## 2015-07-29 DIAGNOSIS — Z3490 Encounter for supervision of normal pregnancy, unspecified, unspecified trimester: Secondary | ICD-10-CM

## 2015-07-29 LAB — CBC AND DIFFERENTIAL
Basophils Absolute Automated: 0.03 10*3/uL (ref 0.00–0.20)
Basophils Automated: 0 %
Eosinophils Absolute Automated: 0.08 10*3/uL (ref 0.00–0.70)
Eosinophils Automated: 1 %
Hematocrit: 37.1 % (ref 37.0–47.0)
Hgb: 13.1 g/dL (ref 12.0–16.0)
Immature Granulocytes Absolute: 0.04 10*3/uL
Immature Granulocytes: 0 %
Lymphocytes Absolute Automated: 2.79 10*3/uL (ref 0.50–4.40)
Lymphocytes Automated: 24 %
MCH: 32.9 pg — ABNORMAL HIGH (ref 28.0–32.0)
MCHC: 35.3 g/dL (ref 32.0–36.0)
MCV: 93.2 fL (ref 80.0–100.0)
MPV: 11.2 fL (ref 9.4–12.3)
Monocytes Absolute Automated: 0.98 10*3/uL (ref 0.00–1.20)
Monocytes: 8 %
Neutrophils Absolute: 7.85 10*3/uL (ref 1.80–8.10)
Neutrophils: 67 %
Nucleated RBC: 0 /100 WBC (ref 0–1)
Platelets: 198 10*3/uL (ref 140–400)
RBC: 3.98 10*6/uL — ABNORMAL LOW (ref 4.20–5.40)
RDW: 13 % (ref 12–15)
WBC: 11.77 10*3/uL — ABNORMAL HIGH (ref 3.50–10.80)

## 2015-07-29 LAB — TYPE AND SCREEN
AB Screen Gel: NEGATIVE
ABO Rh: O POS

## 2015-07-29 MED ORDER — TERBUTALINE SULFATE 1 MG/ML IJ SOLN
0.2500 mg | Freq: Once | INTRAMUSCULAR | Status: DC | PRN
Start: 2015-07-29 — End: 2015-07-30

## 2015-07-29 MED ORDER — ONDANSETRON 8 MG PO TBDP
8.0000 mg | ORAL_TABLET | Freq: Three times a day (TID) | ORAL | Status: DC | PRN
Start: 2015-07-29 — End: 2015-07-30

## 2015-07-29 MED ORDER — LACTATED RINGERS IV SOLN
INTRAVENOUS | Status: DC
Start: 2015-07-29 — End: 2015-07-30

## 2015-07-29 MED ORDER — FAMOTIDINE 10 MG/ML IV SOLN (WRAP)
20.0000 mg | Freq: Two times a day (BID) | INTRAVENOUS | Status: DC | PRN
Start: 2015-07-29 — End: 2015-07-30

## 2015-07-29 MED ORDER — OXYTOCIN-LACTATED RINGERS 30 UNIT/500ML IV SOLN
INTRAVENOUS | Status: AC
Start: 2015-07-29 — End: 2015-07-29
  Administered 2015-07-29: 1 m[IU]/min via INTRAVENOUS
  Filled 2015-07-29: qty 500

## 2015-07-29 MED ORDER — FAMOTIDINE 20 MG PO TABS
20.0000 mg | ORAL_TABLET | Freq: Two times a day (BID) | ORAL | Status: DC | PRN
Start: 2015-07-29 — End: 2015-07-30

## 2015-07-29 MED ORDER — OXYTOCIN-LACTATED RINGERS 30 UNIT/500ML IV SOLN
1.0000 m[IU]/min | INTRAVENOUS | Status: DC
Start: 2015-07-29 — End: 2015-07-30
  Administered 2015-07-30: 999 m[IU]/min via INTRAVENOUS
  Filled 2015-07-29: qty 500

## 2015-07-29 MED ORDER — ONDANSETRON HCL 4 MG/2ML IJ SOLN
4.0000 mg | Freq: Three times a day (TID) | INTRAMUSCULAR | Status: DC | PRN
Start: 2015-07-29 — End: 2015-07-30

## 2015-07-29 NOTE — H&P (Signed)
OBSTETRICS HISTORY & PHYSICAL    Date/Time: 07/29/2015, 9:46 PM  Patient Name: Julia [name]  Attending: [facility]yn Clinic, Physician*    CC: Scheduled induction of labor    History of Present Illness:   Julia [name] is a 20 y.o. G1P0 at [redacted]w[redacted]d, EDD 07/28/2015 (by LMP c/w 19wk [place]) presenting for scheduled induction of labor. Denies VB, LOF, CTX, HA, vision changes, CP, SOB, N/V, RUQ/epigastric pain. Endorses good fetal movement.    Pregnancy c/b:  - Prominent gall bladder on anatomy US, unremarkable on follow-up     Obstetrical History:   G1P0    Gynecological History:   Patient's last menstrual period was 10/21/2014 (within days).  Denies h/o STIs  No Pap smears (<21yo)    Past Medical History:   History reviewed. No pertinent past medical history.    Past Surgical History:   History reviewed. No pertinent past surgical history.    Medications:     Current Discharge Medication List      CONTINUE these medications which have NOT CHANGED    Details   Prenatal Multivit-Min-Fe-FA (PRENATAL VITAMINS) 0.8 MG tablet Take 1 tablet by mouth daily.  Qty: 100 tablet, Refills: 0    Associated Diagnoses: Prenatal care, first pregnancy, first trimester              Allergies:   No Known Allergies    Social History:     Social History     Social History   . Marital Status: Significant Other     Spouse Name: Davina Poke   . Number of Children: 0   . Years of Education: 11th     Occupational History   . unemployed      Social History Main Topics   . Smoking status: Never Smoker    . Smokeless tobacco: Never Used   . Alcohol Use: No   . Drug Use: No   . Sexual Activity:     Partners: Male     Birth Control/ Protection: None      Comment: Referred to FP class     Other Topics Concern   . Not on file     Social History Narrative    1. Have you ever been hit, kicked, slapped, pushed, shoved or threatened by your significant other (husband/ fiance/ boyfriend/ someone you live with)? Denies        2. Have you ever been raped or forced to  engage in sexual activity that you did not want to take part in? Denies        3. Have you recently, or in the past, had any thoughts of hurting yourself or someone else? Denies        4. Have you ever been hospitalized in the past for any psychiatric reasons? Denies    5. On a scale of 1-5, how do you rate your current stress level (add #)? Denies stress          1        2        3         4         5          LOW                                 HIGH        6. Do you feel safe where you  live? Feels safe    7. How many times have you moved in the last 12 months? once    8. If you could change the timing of this pregnancy, would you want it: No change           Earlier        Later        Not at all      No Change         FOB: involved and supportive        OTHER-      4 yrs in the [place]- since Dec 2012    [place]  born, lived [place] 15 [place]rs    11th grade in [place], not enrolled in school    Needs Spanish interpreter    JJofre             Family History:     Family History   Problem Relation Age of Onset   . Diabetes Father    . Cancer Neg Hx    . Hypertension Neg Hx    . Preterm labor Neg Hx      Physical Exam:   BP 123/84 mmHg  Pulse 106  Temp(Src) 98.4 F (36.9 C) (Oral)  Resp 18  LMP 10/21/2014 (Within Days)  Breastfeeding? Yes  Gen: NAD, AOx3  CV: RRR, no M/R/G  Pulm: CTAB, no wheeze  Abd: Gravid, NT, EFW 7lb by Leopolds  LE: No LE edema  SVE (chaperoned by RN): 2-3/70/-3    EFM: baseline 140bpm/moderate variability/no acc/no dec  Toco: ctx q4-59min, not felt by patient    Labs & Imaging:     Results     ** No results found for the last 24 hours. **          Prenatal Labs:  Blood: O POS, ab neg  Rubella: immune  RPR: non-reactive  HBsAg: non-reactive  HIV: non-reactive  GC/CT: negative  TB: negative  Glucola: 124   GBS: negative    Bedside US: cephalic    Assessment & Plan:   20 y.o. G1P0 at [redacted]w[redacted]d presenting for scheduled IOL.  - Rh+/RI/GBS-  - EFM category I   - Admit to L+D  - Continuous EFM/toco  -  T+S, CBC  - LR at 125cc/hr  - Start Pitocin  - AROM when in regular contraction pattern  - CEI as desired, patient declines  - anticipate NSVD    The patient's history, exam findings, labs and imaging, and plan discussed with Dr Delford Field (Chief).    Callum Sallye Ober, MD PGY2    Addendum:  20 yo G1P0 at 40.1wks  IOL at term  Labs reviewed, GBS neg  Favorable cervix, start on Piticoin  AROM, CEI prn    Simmie Davies  PGY4

## 2015-07-29 NOTE — Plan of Care (Signed)
Care plan and education reviewed with patient and family. Verbalizes plan with understanding and agreement. GBS Neg. Pitocin infusing as ordered. Pt desires to go natural for delivery and in control of pain at this time, made aware of epidural if needed. Hemorrhage Risk is low. Will continue to monitor patient.

## 2015-07-30 ENCOUNTER — Encounter (HOSPITAL_BASED_OUTPATIENT_CLINIC_OR_DEPARTMENT_OTHER): Payer: Self-pay

## 2015-07-30 DIAGNOSIS — Z3A40 40 weeks gestation of pregnancy: Secondary | ICD-10-CM

## 2015-07-30 MED ORDER — IBUPROFEN 600 MG PO TABS
600.0000 mg | ORAL_TABLET | Freq: Four times a day (QID) | ORAL | Status: DC
Start: 2015-07-30 — End: 2015-07-31
  Administered 2015-07-30 – 2015-07-31 (×4): 600 mg via ORAL
  Filled 2015-07-30 (×4): qty 1

## 2015-07-30 MED ORDER — CALCIUM CARBONATE ANTACID 500 MG PO CHEW
1000.0000 mg | CHEWABLE_TABLET | Freq: Three times a day (TID) | ORAL | Status: DC | PRN
Start: 2015-07-30 — End: 2015-07-31

## 2015-07-30 MED ORDER — OXYCODONE-ACETAMINOPHEN 5-325 MG PO TABS
1.0000 | ORAL_TABLET | Freq: Once | ORAL | Status: DC | PRN
Start: 2015-07-30 — End: 2015-07-30

## 2015-07-30 MED ORDER — ALUM & MAG HYDROXIDE-SIMETH 200-200-20 MG/5ML PO SUSP
30.0000 mL | Freq: Four times a day (QID) | ORAL | Status: DC | PRN
Start: 2015-07-30 — End: 2015-07-31

## 2015-07-30 MED ORDER — OXYTOCIN-LACTATED RINGERS 30 UNIT/500ML IV SOLN
7.5000 [IU]/h | INTRAVENOUS | Status: DC
Start: 2015-07-30 — End: 2015-07-31

## 2015-07-30 MED ORDER — MEASLES, MUMPS & RUBELLA VAC SC INJ
0.5000 mL | INJECTION | SUBCUTANEOUS | Status: DC | PRN
Start: 2015-07-30 — End: 2015-07-31

## 2015-07-30 MED ORDER — WITCH HAZEL-GLYCERIN EX PADS
1.0000 | MEDICATED_PAD | CUTANEOUS | Status: DC | PRN
Start: 2015-07-30 — End: 2015-07-31
  Administered 2015-07-30: 1 via TOPICAL
  Filled 2015-07-30: qty 40

## 2015-07-30 MED ORDER — OXYTOCIN-LACTATED RINGERS 30 UNIT/500ML IV SOLN
7.5000 [IU]/h | INTRAVENOUS | Status: DC
Start: 2015-07-30 — End: 2015-07-31
  Administered 2015-07-30: 7.5 [IU]/h via INTRAVENOUS

## 2015-07-30 MED ORDER — HYDROCORTISONE 1 % EX OINT
TOPICAL_OINTMENT | Freq: Three times a day (TID) | CUTANEOUS | Status: DC | PRN
Start: 2015-07-30 — End: 2015-07-31

## 2015-07-30 MED ORDER — IBUPROFEN 600 MG PO TABS
600.0000 mg | ORAL_TABLET | Freq: Once | ORAL | Status: AC | PRN
Start: 2015-07-30 — End: 2015-07-30
  Administered 2015-07-30: 600 mg via ORAL
  Filled 2015-07-30: qty 1

## 2015-07-30 MED ORDER — OXYCODONE-ACETAMINOPHEN 5-325 MG PO TABS
1.0000 | ORAL_TABLET | ORAL | Status: DC | PRN
Start: 2015-07-30 — End: 2015-07-31

## 2015-07-30 MED ORDER — LIDOCAINE HCL (PF) 1 % IJ SOLN
INTRAMUSCULAR | Status: AC
Start: 2015-07-30 — End: 2015-07-30
  Filled 2015-07-30: qty 30

## 2015-07-30 MED ORDER — LANOLIN EX OINT
TOPICAL_OINTMENT | CUTANEOUS | Status: DC | PRN
Start: 2015-07-30 — End: 2015-07-31

## 2015-07-30 MED ORDER — SENNOSIDES-DOCUSATE SODIUM 8.6-50 MG PO TABS
1.0000 | ORAL_TABLET | Freq: Every evening | ORAL | Status: DC | PRN
Start: 2015-07-30 — End: 2015-07-31

## 2015-07-30 MED ORDER — BENZOCAINE-MENTHOL 20-0.5 % EX AERO
1.0000 | INHALATION_SPRAY | CUTANEOUS | Status: DC | PRN
Start: 2015-07-30 — End: 2015-07-31
  Administered 2015-07-30: 1 via TOPICAL
  Filled 2015-07-30: qty 56

## 2015-07-30 MED ORDER — SIMETHICONE 80 MG PO CHEW
80.0000 mg | CHEWABLE_TABLET | Freq: Four times a day (QID) | ORAL | Status: DC | PRN
Start: 2015-07-30 — End: 2015-07-31

## 2015-07-30 MED ORDER — ACETAMINOPHEN 650 MG RE SUPP
650.0000 mg | RECTAL | Status: DC | PRN
Start: 2015-07-30 — End: 2015-07-31

## 2015-07-30 MED ORDER — ACETAMINOPHEN 325 MG PO TABS
650.0000 mg | ORAL_TABLET | ORAL | Status: DC | PRN
Start: 2015-07-30 — End: 2015-07-31

## 2015-07-30 MED ORDER — METHYLERGONOVINE MALEATE 0.2 MG/ML IJ SOLN
200.0000 ug | Freq: Once | INTRAMUSCULAR | Status: DC | PRN
Start: 2015-07-30 — End: 2015-07-31

## 2015-07-30 MED ORDER — TETANUS-DIPHTH-ACELL PERTUSSIS 5-2.5-18.5 LF-MCG/0.5 IM SUSP
0.5000 mL | INTRAMUSCULAR | Status: DC | PRN
Start: 2015-07-30 — End: 2015-07-31

## 2015-07-30 NOTE — Progress Notes (Signed)
Labor Check     Pt with significant pain with contractions, pelvic pressure. Reporting nausea. Pitocin currently at 10.    Filed Vitals:    07/30/15 0517 07/30/15 0530 07/30/15 0554 07/30/15 0624   BP: 132/78 182/85 132/90 138/86   Pulse: 77 72 74 96   Temp:       TempSrc:       Resp: 20  20 20      EFM: baseline 140bpm, moderate variability, + accels, no decels  Toco: ctx q2-62min  SVE (chaperoned by RN) 5/100/0    A/P: 20 y.o. G1P0 @ [redacted]w[redacted]d, admitted for induction of labor, in latent labor  - Rh+/RI/GBS neg  - EFM category 1  - CEI as desired  - Up-titrate Pitocin as needed  - Continue current mgmt  - Anticipate NSVD    Tysean [place]and[place]et Sallye Ober, MD PGY2

## 2015-07-30 NOTE — Plan of Care (Signed)
Lactation consult completed.  POC reviewed, patient verbalizes understanding and continues to progress.    Patient exclusively breastfeeding infant: yes  Patient feeding formula:no  Patient pumping: no

## 2015-07-30 NOTE — Progress Notes (Signed)
S: Called to bedside by RN, pt feeling more pressure    O:  Filed Vitals:    07/30/15 0830 07/30/15 0912 07/30/15 0930 07/30/15 1000   BP: 138/88 128/80 135/88 136/87   Pulse: 70 117 75 87   Temp:    97.8 F (36.6 C)   TempSrc:    Oral   Resp: 20  20 22        FHT: 140 mod/+acc/early decelerations  Toco: ctx q , pitocin at 6  SVE: c/c/+1    GBS: negative   Membrane: AROM, clear 0230    A/P: 20 yo G1P0 at 40w2 admitted for IOL  - Category I tracing  - Toco ctx q , will continue pitocin  - SVE c/c/+1  - AROM, clear at 0230  - Declines CEI  - Anticipate NSVD     Remo Lipps, DO (PGY-1)

## 2015-07-30 NOTE — Progress Notes (Signed)
S: Called to bedside by RN as patient is feeling increase in pressure and is trying to bear down.     OCeasar Mons Vitals:    07/30/15 0728 07/30/15 0804 07/30/15 0830 07/30/15 0912   BP: 140/89 155/85 138/88 128/80   Pulse: 73 72 70 117   Temp:  98.2 F (36.8 C)     TempSrc:  Oral     Resp:  20         FHT: 130 mod/+acc/early decelerations  Toco: ctx q 2 mins, pitocin at 6  SVE: 9.5/c/+1    GBS: negative   Membrane: AROM, clear at 0230    A/P: 20 yo G1P0 at 40w2 admitted for IOL  - Category I tracing  - Toco ctx q , will continue pitocin  - SVE 9.5/c/+1  - AROM, clear at 0230  - Declines CEI  - Anticipate NSVD     Remo Lipps, DO (PGY-1)

## 2015-07-30 NOTE — Progress Notes (Signed)
Asssuming care of this 20 yo G1P0 IOl @ [redacted]w[redacted]d  Filed Vitals:    07/30/15 0554 07/30/15 0624 07/30/15 0659 07/30/15 0728   BP: 132/90 138/86 139/84 140/89   Pulse: 74 96 78 73   Temp:   98.2 F (36.8 C)    TempSrc:   Oral    Resp: 20 20       Results     Procedure Component Value Units Date/Time    Type and screen [[id number]] Collected:  07/29/15 2201    Specimen Information:  Blood Updated:  07/29/15 2259     ABO Rh O POS      AB Screen Gel NEG     Gonococcus culture [[id number]] Resulted:  01/01/15     Specimen Information:  Other Updated:  07/29/15 2220     Chlamydia trachomatis Culture Negative      Culture Gonorrhoeae Negative     CBC and differential [[id number]]  (Abnormal) Collected:  07/29/15 2201    Specimen Information:  Blood from Blood Updated:  07/29/15 2214     WBC 11.77 (H) x10 3/uL      Hgb 13.1 g/dL      Hematocrit 32.4 %      Platelets 198 x10 3/uL      RBC 3.98 (L) x10 6/uL      MCV 93.2 fL      MCH 32.9 (H) pg      MCHC 35.3 g/dL      RDW 13 %      MPV 11.2 fL      Neutrophils 67 %      Lymphocytes Automated 24 %      Monocytes 8 %      Eosinophils Automated 1 %      Basophils Automated 0 %      Immature Granulocyte 0 %      Nucleated RBC 0 /100 WBC      Neutrophils Absolute 7.85 x10 3/uL      Abs Lymph Automated 2.79 x10 3/uL      Abs Mono Automated 0.98 x10 3/uL      Abs Eos Automated 0.08 x10 3/uL      Absolute Baso Automated 0.03 x10 3/uL      Absolute Immature Granulocyte 0.04 x10 3/uL         IUPC in place  On 10 of pit  Last exam 5/c/0  GBS neg  No CEI  FHT 140 bpm mod var +accels + early decels  Ctx q 2-3 minutes  Will increaser pitocin to 12 and PRN    Aya Tula Schryver PGY4

## 2015-07-30 NOTE — Lactation Note (Signed)
Initial Lactation Visit:  G1P1001  Delivered: 07/30/2015 10:32 AM   Female  via Vaginal, Spontaneous Delivery  at [redacted]w[redacted]d    Birth Weight: 7 lb 6.5 oz (3360 g)   Feeding Type:  Breast milk   APGAR: 8   and 9      No Known Allergies  History reviewed. No pertinent past medical history.  History reviewed. No pertinent past surgical history.    Review breastfeeding basics and proper positioning & hand placement for deep latch.  Baby positioned at the breast and assisted Mom in latching baby in cross cradle hold to [facility] on  L breast. Baby latched on and sustained for several minutes due to sleepiness & fussiness at the breast.  Baby able to sustain suckling well after calm the baby down.     Encouraged to practice skin to skin contact and offer breast with all hunger cues (at least 8-12 times per 24 hours).   Watch baby for signs of effective feedings (adequate number of voids, stools and minimal weight loss).Avoid pacifiers and formula supplements unless it is medically indicated.   Also mentioned normal newborn behaviors, including sleep patterns and cluster feeding.   Reviewed breast feeding section of Mother/Infant booklet.  Contact number given - Patient to call for questions or assistance as needed.   Follow up in am.

## 2015-07-30 NOTE — Progress Notes (Signed)
RN called Dr. Windle Guard regarding inability to pick up patients contractions. Dr. York Spaniel it is okay to increase the pitocin as ordered.

## 2015-07-30 NOTE — Plan of Care (Signed)
Pt. admitted to [facility] and arrived on unit with infant in arms.  RN checked for matching bands on infant, mother, and father of baby, and infant sensor is intact. Pt VSS, and fundus is firm with minimal bleeding. Pt reports minimal pain at this time, will medicate appropriately RN oriented pt to room, and discussed safety and admission teaching, pt verbalized understanding. Will continue to follow plan of care.

## 2015-07-30 NOTE — Progress Notes (Signed)
Labor Check     Pt tolerating contractions well. Pitocin currently at 6.    Filed Vitals:    07/29/15 2225 07/29/15 2255 07/29/15 2330 07/30/15 0000   BP: 112/71 113/65 116/68 122/74   Pulse: 77 80 80 75   Temp:    98.3 F (36.8 C)   TempSrc:    Oral   Resp: 18 18 18 18      EFM: baseline 130bpm, moderate variability, + accels, no decels  Toco: ctx q30min  SVE (chaperoned by RN) 4/70/-2, AROM for scant clear fluid, IUPC placed.    A/P: 20 y.o. G1P0 @ [redacted]w[redacted]d, admitted for induction of labor, in latent labor  - Rh+/RI/GBS neg  - EFM category 1  - CEI as desired  - Up-titrate Pitocin as needed  - Continue current mgmt  - Anticipate NSVD    Julia [name] Sallye Ober, MD PGY2

## 2015-07-30 NOTE — Progress Notes (Signed)
S: Patient uncomfortable. Declines CEE.     O:  Filed Vitals:    07/30/15 0659 07/30/15 0728 07/30/15 0804 07/30/15 0830   BP: 139/84 140/89 155/85 138/88   Pulse: 78 73 72 70   Temp: 98.2 F (36.8 C)  98.2 F (36.8 C)    TempSrc: Oral  Oral    Resp:   20        FHT: 140 mod/+acc/early decelerations  Toco: ctx q 1-2 mins, pitocin at 12  SVE: 8/c/0    GBS: negative   Membrane: AROM, clear at 0230    A/P: 20 yo G1P0 at 40w2 admitted for IOL  - Category I tracing  - Toco ctx q 1-58mins, will continue pitocin  - SVE 8/c/0  - AROM, clear at 0230  - Declines CEI  - Anticipate NSVD     Remo Lipps, DO (PGY-1)

## 2015-07-30 NOTE — Plan of Care (Signed)
Pt undergoing induction of labor, coping well with contractions. Prefers NCB, refuses epidural at this time. Maternal and fetal VSS. Discussed plan of care, patient verbalizes understanding and agreement. Encouraged to call with questions or concerns.

## 2015-07-31 MED ORDER — MEDROXYPROGESTERONE 150 MG/ML IM (WRAP)
150.0000 mg | Freq: Once | INTRAMUSCULAR | Status: AC
Start: 2015-07-31 — End: 2015-07-31
  Administered 2015-07-31: 150 mg via INTRAMUSCULAR
  Filled 2015-07-31: qty 1

## 2015-07-31 MED ORDER — IBUPROFEN 600 MG PO TABS
600.0000 mg | ORAL_TABLET | Freq: Four times a day (QID) | ORAL | Status: AC
Start: 2015-07-31 — End: ?

## 2015-07-31 NOTE — Plan of Care (Signed)
Patient assessment completed, VSS, voices adequate pain control. Fall safety, RN rounding and POC reviewed with patient, questions answered and understanding verbalized. Patient understands to call for breast feeding assistance and any other questions/concerns. Patient progressing, will monitor for any changes.

## 2015-07-31 NOTE — Progress Notes (Signed)
PPD#1    S: Feeling well this am, no complaints. Bonding w/ baby. Voiding freely, ambulating, tolerating PO. Lochia < menses. Pain well-controlled. Breast and bottle feeding. Denies fever, chills, headache, visual changes, lightheadedness, dizziness, SOB, chest pain, palpitations, N/V.    O:  Temp:  [97.5 F (36.4 C)-98.2 F (36.8 C)] 97.6 F (36.4 C)  Heart Rate:  [70-117] 73  Resp Rate:  [16-22] 16  BP: (112-155)/(68-89) 112/74 mmHg  Gen: NAD  Abd: soft, NT, FF u-3  Ext: no c/c/e    Results     ** No results found for the last 24 hours. **          A/P: 20 y.o. G1P1001 s/p term NSVD, PPD#1  Rh+/RI/VFI  Afebrile, HD stable  Meeting all milestones   Contraception: depo  [place] home today w/ follow-up clinic appointment in 6 weeks    Remo Lipps, DO (PGY-1)

## 2015-07-31 NOTE — Plan of Care (Signed)
Patient is stable without questions or concerns. POC reviewed this shift and patient verbalizes understanding and continues to progress.    Patient pain level within patient stated goals:  Yes  Patient is breastfeeding infant:  Yes  Patient is pumping:  No  Patient's lochia appropriate:  Yes: Lochia is scant, rubra, free of clots and foul odors  Patient voiding without difficulty:  Yes  Nutrition adequate for discharge: Yes  Patient reports constipation:  No  Narcotic side effects of constipations and drowsiness reviewed: Yes  Falls education for mother completed: Yes  Hourly rounding:  Yes  Newborn Fall education to patient completed: Yes    Patient agrees to notify RN of any changes in her condition and status.

## 2015-07-31 NOTE — Plan of Care (Signed)
Pt VS and assessment WNL. Pain well controlled with motrin. Pt to be discharged home today pending okay from pediatrician for infant.

## 2015-07-31 NOTE — Progress Notes (Signed)
Discharge instructions reviewed with pt. Pt verbalized understanding of follow up appts, signs and symptoms of when to call physician, as well as general post partum care. Post partum depression reviewed with pt.

## 2015-07-31 NOTE — Discharge Instructions (Signed)
Despus de un parto vaginal  Despus del nacimiento de su beb, es posible que su cuerpo est muy cansado. Recuperarse de un parto vaginal puede llevar tiempo. Quizs usted permanezca en el hospital o centro de maternidad entre uno y cuatro das. Tambin es posible que pueda regresar a su casa el mismo da.    Inmediatamente despus del parto  Le tomarn la temperatura y la presin arterial hasta que se le estabilicen. Una enfermera u otro proveedor de atencin mdica la observar mientras usted descansa. Quizs tenga dolores del posparto, que son clicos causados por el encogimiento del tero. Se usan toallas sanitarias para absorber el flujo del revestimiento uterino. Para garantizar que no est sangrando demasiado, le revisarn la toalla sanitaria. Adems, una enfermera le examinar la firmeza del tero aplicndole una presin suave en el estmago. Si le pusieron anestesia, la vigilarn estrechamente hasta que usted pueda sentir y mover los dedos de los pies. Si tiene dolor perineal (en la zona entre la vagina y el ano), podr alivirselo con una compresa de hielo.  El cuidado del recin nacido  Mientras usted se encuentre todava en el hospital o centro de maternidad, le ensearn a cargar a su beb en brazos y a alimentarlo. Tambin le darn consejos sobre los cuidados de su recin nacido, cmo baar al beb e instrucciones para amamantarlo.  Preparativos para regresar a su casa  Quizs est ansiosa por irse a su casa lo antes posible. Antes de que la dejen irse con su recin nacido, un proveedor de atencin mdica la examinar para garantizar que usted est lo suficientemente sana como para cuidar de su propia salud y la de su beb. Estar lista para regresar a casa cuando:   Pueda caminar hasta el bao y usarlo sin ayuda.   Pueda comer alimentos slidos y tragar pastillas (si las necesita).   No tenga seales de infeccin ni de otros problemas de salud, como fiebre.   Tenga un adecuado control del  dolor.   Su sangrado no sea excesivo.   Pueda cuidar de su recin nacido y est emocionalmente estable.  Antes de salir del hospital o centro de maternidad, le darn instrucciones sobre cmo cuidar de usted en su hogar despus de un parto vaginal. Asegrese de seguir estas instrucciones atentamente. Si tiene preguntas o preocupaciones, hable de eso con su proveedor ahora.  Si le pusieron puntos  Es posible que le hayan puesto puntos en la piel cercana a su vagina. Los puntos pueden haber cerrado una episiotoma (incisin que agranda la abertura de la vagina) o reparado piel desgarrada. De cualquier forma, los puntos deberan disolvrsele en unas cuantas semanas. Hasta ese momento, mantenga limpios los puntos para ayudar a aliviar las molestias, facilitar la cicatrizacin y reducir su riesgo de infecciones. Podran servirle los siguientes consejos:   Despus de orinar o evacuar sus intestinos, lmpiese desde adelante hacia atrs.   Despus de limpiarse, rocese la zona con agua tibia. O bien, tome un bao de asiento. Esto significa sentarse en una tina que contiene unas cuantas pulgadas de agua tibia. Por ltimo, squese la zona con palmaditas o con un secador de pelo en un ajuste de baja temperatura.   No use jabn ni ninguna solucin excepto agua en la zona.   Puede ducharse a menos que le digan que no lo haga.   Cmbiese las toallas sanitarias como mnimo cada dos a cuatro horas.   Aplique compresas fras o calientes en la zona, segn le hayan indicado sus   proveedores de atencin mdica o enfermeras. Pngase una toalla delgada entre la compresa y la piel.   Sintese en asientos firmes para que los puntos se estiren menos.  Visita de control posnatal  Programe una visita de control posnatal con su proveedor de atencin mdica para unas seis semanas despus del parto. Durante este examen le examinarn el tero y la zona vaginal. Pngase en contacto con su proveedor de atencin mdica si piensa que usted o  su beb tienen algn problema.  Cundo llamar a su proveedor de atencin mdica  Llame inmediatamente a su proveedor de atencin mdica si tiene alguno de los siguientes sntomas:   Fiebre de 100.4 F (38.0 C) o ms alta.   Sangrado que empapa una nueva toalla sanitaria despus de una hora o que contiene cogulos grandes.   Dolor en la vagina que empeora y que no se alivia con los medicamentos.   Enrojecimiento, flujo o ms dolor a causa del desgarro vaginal o la episiotoma.   Ardor, dolor, rayas rojas o una zona con ndulos en los senos junto, quizs, con sntomas similares a los de la gripe.   Grietas, ampollas o sangre en los pezones.   Ardor o dolor al orinar.   Nusea o vmito.   Mareos o desmayos.   Sensacin de tristeza extrema o ansiedad, o falta de ganas de estar con su beb.   Dolor abdominal que no se alivia con medicamentos.   Flujo vaginal maloliente.   Ninguna evacuacin de los intestinos en cinco das.   Dolor al orinar o incapacidad de controlar la orina.   Enrojecimiento, sensacin de calor o dolor en la parte inferior de la pierna.   Dolor en el pecho.     Date Last Reviewed: 12/30/2013   2000-2016 [employer] [address], [place] [address]. Todos los derechos reservados. Esta informacin no pretende sustituir la atencin mdica profesional. Slo su mdico puede diagnosticar y tratar un problema de salud.

## 2015-07-31 NOTE — Discharge Summary (Signed)
Obstetrical Discharge Form    Primary OB Clinician: [facility] ([facility]    EDC: Estimated Date of Delivery: 07/28/15    Gestational Age: [redacted]w[redacted]d    Antepartum Complications:   - Prominent gall bladder on anatomy US, unremarkable on follow-up    Date of Delivery: 07/30/2015  Time of Delivery:  10:32 AM      Delivered By: Alroy Bailiff R     Delivery Type: Vaginal, Spontaneous Delivery     Tubal Ligation: no    Baby: Liveborn Female    Apgar 1 minute: 8    Apgar 5 minute: 9    Birth weight: 7 lb 6.5 oz (3360 g)     Anesthesia: None     Delivery Complications: None                                            Laceration: None  Laceration Type:        Episiotomy: None     Episiotomy/Laceration Repair:        Placenta:  07/30/2015   10:38 AM   Spontaneous   Intact     Feeding Method: both breast and bottle    Rh Immune Globulin given: no    Rubella Vaccine given: no    Tdap Vaccine given: no  Immunization History   Administered Date(s) Administered   . Tdap 05/21/2015       Discharge Date/Time:  07/31/2015    Plan:     - Continue with routine postpartum care and breastfeeding support.  - Contraceptive options discussed with patient. Pt opted for depo.  - Postpartum instructions given to patient.  - Follow-up clinic appointment in 6 weeks or sooner if needed.      Remo Lipps, DO, PGY-1

## 2015-07-31 NOTE — Plan of Care (Signed)
Lactation consult completed.  POC reviewed, patient verbalizes understanding and continues to progress.    Patient exclusively breastfeeding infant: yes  Patient feeding formula:no  Patient pumping: no    Follow up lactation consult.  Ms. Aybar asked when her milk would come in; educated on how to ensure newborn is drinking at the breast, how many wet diapers to expect per day of life, STS.  Newborn placed STS but had been recently fed and was disinterested in feeding at this time.  Provided info sheet on latching for reference.  Educated on the difference between drinking swallows and comfort suckling, and discussed using breast compressions to convert suckling to drinking as needed.    Breastfeeding teaching reviewed.  Avoid formula supplements unless it is medically indicated. Watch baby for signs of effective feedings (adequate number of voids, stools and minimal weight loss).   Encouraged to do skin to skin and breast feed with all cues (at least 8-12 times per 24 hours).    Reviewed breast feeding section of Mother/Infant booklet.  Contact number given - Patient to call for questions or assistance as needed.    Follow up PRN - anticipate discharge today.

## 2015-09-24 ENCOUNTER — Ambulatory Visit: Payer: Medicaid Other | Attending: Family | Admitting: Obstetrics & Gynecology

## 2015-09-24 NOTE — Progress Notes (Signed)
19 y.o. G1P1001 who presents for a postpartum visit following a NSVD on 3/1. Pt is not breastfeeding. Bleeding present: daily spotting and some days moderate (2 pads). Bladder fxn is NL. Bowel fxn is NL. Pt has has been sexually active since delivery. Denies symptoms of depression.  Denies urinary frequency, dysuria, hematuria, incontinence  Denies diarrhea, constipation, fecal incontinence  Contraception method: depo 3/2   Patient's last menstrual period was 10/21/2014 (within days).    Gen: NAD, AOx3  CV: RRR, no M/R/G  Pulm: CTAB, no wheezes  Abd: Soft, NT/ND  Ext: No LE edema  Breast: No erythema or nipple discharge, no masses appreciated  SSE: Normal external female genitalia, normal vaginal/cervical mucosa w/out lesions or ulcerations, no blood within the vaginal vault, cervix appears long and closed - normal discharge after pregnancy seen - no blood or active bleeding  SVE: Cervix long/closed, no CMT, no adnexal masses, no adnexal tenderness     A/P:  - Postpartum depression screening 3: moving to [place] at the end of week to be closer with family. Presents with supportive FOB today  - Contraception: will need next dep in 2 months, discussed IUD  - PAP: when 20 years old  - Follow-up as needed    J. Ree Shay MD PGY1

## 2015-09-26 ENCOUNTER — Encounter (HOSPITAL_BASED_OUTPATIENT_CLINIC_OR_DEPARTMENT_OTHER): Payer: Self-pay

## 2015-10-03 NOTE — Progress Notes (Signed)
I have reviewed the notes, assessments, and/or procedures performed by r. Ree Shay, I concur with her documentation of Julia [name].

## 2015-11-03 ENCOUNTER — Emergency Department (HOSPITAL_COMMUNITY): Payer: Medicaid Other

## 2015-11-03 ENCOUNTER — Encounter (HOSPITAL_COMMUNITY): Payer: Self-pay | Admitting: Emergency Medicine

## 2015-11-03 DIAGNOSIS — R079 Chest pain, unspecified: Secondary | ICD-10-CM | POA: Insufficient documentation

## 2015-11-03 DIAGNOSIS — R1032 Left lower quadrant pain: Secondary | ICD-10-CM | POA: Diagnosis not present

## 2015-11-03 DIAGNOSIS — R6883 Chills (without fever): Secondary | ICD-10-CM | POA: Diagnosis not present

## 2015-11-03 DIAGNOSIS — R112 Nausea with vomiting, unspecified: Secondary | ICD-10-CM | POA: Diagnosis not present

## 2015-11-03 LAB — BASIC METABOLIC PANEL
ANION GAP: 9 (ref 5–15)
BUN: 13 mg/dL (ref 6–20)
CO2: 24 mmol/L (ref 22–32)
Calcium: 9.4 mg/dL (ref 8.9–10.3)
Chloride: 106 mmol/L (ref 101–111)
Creatinine, Ser: 0.71 mg/dL (ref 0.44–1.00)
Glucose, Bld: 88 mg/dL (ref 65–99)
Potassium: 3.3 mmol/L — ABNORMAL LOW (ref 3.5–5.1)
Sodium: 139 mmol/L (ref 135–145)

## 2015-11-03 LAB — CBC
HCT: 36.3 % (ref 36.0–46.0)
HEMOGLOBIN: 11.9 g/dL — AB (ref 12.0–15.0)
MCH: 28.5 pg (ref 26.0–34.0)
MCHC: 32.8 g/dL (ref 30.0–36.0)
MCV: 86.8 fL (ref 78.0–100.0)
Platelets: 232 10*3/uL (ref 150–400)
RBC: 4.18 MIL/uL (ref 3.87–5.11)
RDW: 12.5 % (ref 11.5–15.5)
WBC: 10.7 10*3/uL — ABNORMAL HIGH (ref 4.0–10.5)

## 2015-11-03 LAB — I-STAT TROPONIN, ED: TROPONIN I, POC: 0 ng/mL (ref 0.00–0.08)

## 2015-11-03 NOTE — ED Notes (Signed)
Patient here with chest pain that started this morning, with nausea off and on.  Patient denies any shortness of breath.

## 2015-11-04 ENCOUNTER — Emergency Department (HOSPITAL_COMMUNITY)
Admission: EM | Admit: 2015-11-04 | Discharge: 2015-11-04 | Disposition: A | Discharge status: Home / Self Care | Payer: Medicaid Other | Attending: Emergency Medicine | Admitting: Emergency Medicine

## 2015-11-04 DIAGNOSIS — R079 Chest pain, unspecified: Secondary | ICD-10-CM

## 2015-11-04 LAB — D-DIMER, QUANTITATIVE: D-Dimer, Quant: 0.33 ug/mL-FEU (ref 0.00–0.50)

## 2015-11-04 MED ORDER — POTASSIUM CHLORIDE CRYS ER 20 MEQ PO TBCR
40.0000 meq | EXTENDED_RELEASE_TABLET | Freq: Once | ORAL | Status: AC
Start: 2015-11-04 — End: 2015-11-04
  Administered 2015-11-04: 40 meq via ORAL
  Filled 2015-11-04: qty 2

## 2015-11-04 NOTE — ED Provider Notes (Signed)
CSN: [id number]     Arrival date & time 11/03/15  2052 History  By signing my name below, I, Emmanuella Mensah, attest that this documentation has been prepared under the direction and in the presence of Tomasita CrumbleAdeleke Simrit Gohlke, MD. Electronically Signed: Angelene GiovanniEmmanuella Mensah, ED Scribe. 11/04/2015. 1:03 AM.    Chief Complaint  Patient presents with  . Chest Pain  . Nausea   The history is provided by the patient. No language interpreter was used.   HPI Comments: Katherine Hess is a 20 y.o. female who presents to the Emergency Department complaining of gradually worsening "pressure" left chest pain onset yesterday morning. She reports one episode of vomiting, nausea, chills, and intermittent left lower quadrant pain. No alleviating factors noted. Pt has not tried any medications PTA. Pt denies any recent URI symptoms. She states that she gave birth 3 months ago and moved from [place] one moth ago driving for approx. 5 hours. Pt currently has an Implanon for birth control. She denies any SOB or leg swelling.    History reviewed. No pertinent past medical history. History reviewed. No pertinent past surgical history. No family history on file. Social History  Substance Use Topics  . Smoking status: Never Smoker   . Smokeless tobacco: None  . Alcohol Use: No   OB History    No data available     Review of Systems  Constitutional: Positive for chills.  HENT: Negative for rhinorrhea and sore throat.   Respiratory: Negative for shortness of breath.   Cardiovascular: Positive for chest pain. Negative for leg swelling.  Gastrointestinal: Positive for nausea and vomiting.  All other systems reviewed and are negative.     Allergies  Review of patient's allergies indicates no known allergies.  Home Medications   Prior to Admission medications   Medication Sig Start Date End Date Taking? Authorizing Provider  ibuprofen (ADVIL,MOTRIN) 600 MG tablet Take 1 tablet (600 mg total) by mouth every 6 (six)  hours as needed. 06/15/14   Renne CriglerJoshua Geiple, PA-C   BP 121/75 mmHg  Pulse 100  Temp(Src) 98.5 F (36.9 C)  Resp 16  Ht 5\' 6"  (1.676 m)  Wt 208 lb (94.348 kg)  BMI 33.59 kg/m2  SpO2 100%  LMP 10/30/2015 (Exact Date) Physical Exam  Constitutional: She is oriented to person, place, and time. She appears well-developed and well-nourished. No distress.  HENT:  Head: Normocephalic and atraumatic.  Nose: Nose normal.  Mouth/Throat: Oropharynx is clear and moist. No oropharyngeal exudate.  Eyes: Conjunctivae and EOM are normal. Pupils are equal, round, and reactive to light. No scleral icterus.  Neck: Normal range of motion. Neck supple. No JVD present. No tracheal deviation present. No thyromegaly present.  Cardiovascular: Normal rate, regular rhythm and normal heart sounds.  Exam reveals no gallop and no friction rub.   No murmur heard. Pulmonary/Chest: Effort normal and breath sounds normal. No respiratory distress. She has no wheezes. She exhibits no tenderness.  Abdominal: Soft. Bowel sounds are normal. She exhibits no distension and no mass. There is no tenderness. There is no rebound and no guarding.  Musculoskeletal: Normal range of motion. She exhibits no edema or tenderness.  Lymphadenopathy:    She has no cervical adenopathy.  Neurological: She is alert and oriented to person, place, and time. No cranial nerve deficit. She exhibits normal muscle tone.  Skin: Skin is warm and dry. No rash noted. No erythema. No pallor.  Nursing note and vitals reviewed.   ED Course  Procedures (including critical  care time) DIAGNOSTIC STUDIES: Oxygen Saturation is 100% on RA, normal by my interpretation.    COORDINATION OF CARE: 1:00 AM- Pt advised of plan for treatment and pt agrees. Pt informed of her x-ray and lab results. She will receive lab work and a D-dimer for further evaluation. Pt will also receive Potassium tablets.   Labs Review Labs Reviewed  BASIC METABOLIC PANEL - Abnormal;  Notable for the following:    Potassium 3.3 (*)    All other components within normal limits  CBC - Abnormal; Notable for the following:    WBC 10.7 (*)    Hemoglobin 11.9 (*)    All other components within normal limits  I-STAT TROPOININ, ED    Imaging Review Dg Chest 2 View  11/03/2015  CLINICAL DATA:  Left-sided chest pain for 2 days EXAM: CHEST  2 VIEW COMPARISON:  None. FINDINGS: The heart size and mediastinal contours are within normal limits. Both lungs are clear. The visualized skeletal structures are unremarkable. IMPRESSION: No active cardiopulmonary disease. Electronically Signed   By: Alcide Clever M.D.   On: 11/03/2015 21:17   Tomasita Crumble, MD has personally reviewed and evaluated these images and lab results as part of his medical decision-making.   EKG Interpretation   Date/Time:  Monday November 03 2015 20:57:04 EDT Ventricular Rate:  113 PR Interval:  132 QRS Duration: 90 QT Interval:  324 QTC Calculation: 444 R Axis:   77 Text Interpretation:  Sinus tachycardia Incomplete right bundle branch  block Otherwise normal ECG No old tracing to compare Confirmed by Erroll Luna [phone]) on 11/04/2015 12:34:31 AM      MDM   Final diagnoses:  None    Patient presents to the ED for chest pain.  She recently delivered and states she may be on estrogen birth control so dimer was obtained and was negative.  EKG shows tachycardia, this has resolved without any ED intervention.  Potassium replaced. Her history is not consistent with ACS.  Could be a very early postpartum cardiomyopathy, but she has no evidence of fluid overload currently.  She was advised to see her PCP within 3 days for close follow up. She appears well and in NAD.  No events overnight in the ED.  VS remain within her normal limits and she is safe for DC.   I personally performed the services described in this documentation, which was scribed in my presence. The recorded information has been reviewed and  is accurate.     Tomasita Crumble, MD 11/04/15 [id number]

## 2015-11-04 NOTE — Discharge Instructions (Signed)
Nonspecific Chest Pain Katherine Hess, your blood work, chest xray and EKG today were all normal.  See a primary care doctor within 3 days for close follow up of your chest pain. If symptoms worsen, come back to the ED immediately. Thank you.   Katherine Hess, su anlisis de [place], Katherine radiografa de trax y el EKG de hoy fueron normales. Consulte a un mdico de atencin primaria dentro de 3 das para un seguimiento cercano de su dolor de pecho. Si los sntomas empeoran, vuelva a Katherine ED inmediatamente. Gracias.   It is often hard to find the cause of chest pain. There is always a chance that your pain could be related to something serious, such as a heart attack or a blood clot in your lungs. Chest pain can also be caused by conditions that are not life-threatening. If you have chest pain, it is very important to follow up with your doctor.  HOME CARE  If you were prescribed an antibiotic medicine, finish it all even if you start to feel better.  Avoid any activities that cause chest pain.  Do not use any tobacco products, including cigarettes, chewing tobacco, or electronic cigarettes. If you need help quitting, ask your doctor.  Do not drink alcohol.  Take medicines only as told by your doctor.  Keep all follow-up visits as told by your doctor. This is important. This includes any further testing if your chest pain does not go away.  Your doctor may tell you to keep your head raised (elevated) while you sleep.  Make lifestyle changes as told by your doctor. These may include:  Getting regular exercise. Ask your doctor to suggest some activities that are safe for you.  Eating a heart-healthy diet. Your doctor or a diet specialist (dietitian) can help you to learn healthy eating options.  Maintaining a healthy weight.  Managing diabetes, if necessary.  Reducing stress. GET HELP IF:  Your chest pain does not go away, even after treatment.  You have a rash with blisters on your  chest.  You have a fever. GET HELP RIGHT AWAY IF:  Your chest pain is worse.  You have an increasing cough, or you cough up blood.  You have severe belly (abdominal) pain.  You feel extremely weak.  You pass out (faint).  You have chills.  You have sudden, unexplained chest discomfort.  You have sudden, unexplained discomfort in your arms, back, neck, or jaw.  You have shortness of breath at any time.  You suddenly start to sweat, or your skin gets clammy.  You feel nauseous.  You vomit.  You suddenly feel light-headed or dizzy.  Your heart begins to beat quickly, or it feels like it is skipping beats. These symptoms may be an emergency. Do not wait to see if the symptoms will go away. Get medical help right away. Call your local emergency services (911 in the [place] Do not drive yourself to the hospital.   This information is not intended to replace advice given to you by your health care provider. Make sure you discuss any questions you have with your health care provider.   Document Released: 11/03/2007 Document Revised: 06/07/2014 Document Reviewed: 12/21/2013 Elsevier Interactive Patient Education 2016 [employer]. Dolor de pecho inespecfico (Nonspecific Chest Pain) Suele ser difcil encontrar Katherine causa del dolor de [place]. Siempre existe una posibilidad de que el dolor est relacionado con algo grave, como un infarto de miocardio o un cogulo sanguneo en los pulmones. Hay muchas  enfermedades que no son potencialmente mortales que pueden causar dolor de [place]. Es importante que concurra a las visitas de control con el mdico.  CUIDADOS EN EL HOGAR  Si le recetaron antibiticos, asegrese de terminarlos, incluso si comienza a Actorsentirse mejor.  Evite las SUPERVALU INCactividades que le causen dolor de [place].  No consuma ningn producto que contenga tabaco, lo que incluye cigarrillos, tabaco de Theatre managermascar o Administrator, Civil Servicecigarrillos electrnicos. Si necesita ayuda para dejar de fumar, consulte al  mdico.  No beba alcohol.  Tome los medicamentos solamente como se lo haya indicado el mdico.  Concurra a todas las visitas de control como se lo haya indicado el mdico. Esto es importante. Esto incluye otros estudios si el dolor de pecho no desaparece.  El mdico puede indicarle que mantenga Katherine cabeza levantada (elevada) mientras duerme.  Haga cambios en su estilo de vida segn las indicaciones del mdico. Estos pueden incluir lo siguiente:  Education administratorracticar actividad fsica con regularidad. Pdale al mdico que le sugiera algunas actividades que sean seguras para usted.  Consumir una dieta cardiosaludable. El mdico o un especialista en alimentacin (nutricionista) pueden ayudarlo a que haga elecciones saludables.  Mantener un peso saludable.  Controlar Katherine diabetes, si es necesario.  Reducir las situaciones de estrs. SOLICITE AYUDA SI:  El dolor de pecho no desaparece, incluso despus del tratamiento.  Tiene una erupcin cutnea con ampollas en el pecho.  Tiene fiebre. SOLICITE AYUDA DE INMEDIATO SI:  El dolor en el pecho es ms intenso.  Katherine tos empeora, o expectora sangre.  Siente un dolor intenso en el vientre (abdomen).  Se siente muy dbil.  Pierde el conocimiento (se desmaya).  Tiene escalofros.  Tiene una molestia repentina e inexplicable en el pecho.  Tiene molestias repentinas e Exxon Mobil Corporationinexplicables en los brazos, Katherine espalda, el cuello o Katherine [place]mandbula.  Le falta el aire en cualquier momento.  Comienza a sudar de [place]manera repentina o Katherine piel se le humedece.  Siente nuseas.  Vomita.  Se siente repentinamente mareado o se desmaya.  Siente que el corazn comienza a latir rpidamente o que se saltea latidos. Estos sntomas pueden Customer service managerindicar una emergencia. No espere hasta que los sntomas desaparezcan. Solicite atencin mdica de inmediato. Comunquese con el servicio de emergencias de su localidad (911 en los [place]). No conduzca por sus propios medios Dillard'shasta el  hospital.   Esta informacin no tiene Theme park managercomo fin reemplazar el consejo del mdico. Asegrese de hacerle al mdico cualquier pregunta que tenga.   Document Released: 08/13/2008 Document Revised: 06/07/2014 Elsevier Interactive Patient Education [employer]2016 Elsevier Inc.

## 2016-07-09 ENCOUNTER — Encounter (HOSPITAL_COMMUNITY): Payer: Self-pay | Admitting: *Deleted

## 2016-07-09 ENCOUNTER — Emergency Department (HOSPITAL_COMMUNITY)
Admission: EM | Admit: 2016-07-09 | Discharge: 2016-07-09 | Disposition: A | Discharge status: Home / Self Care | Payer: Self-pay | Attending: Emergency Medicine | Admitting: Emergency Medicine

## 2016-07-09 ENCOUNTER — Emergency Department (HOSPITAL_COMMUNITY): Payer: Self-pay

## 2016-07-09 DIAGNOSIS — F458 Other somatoform disorders: Secondary | ICD-10-CM

## 2016-07-09 DIAGNOSIS — R064 Hyperventilation: Secondary | ICD-10-CM | POA: Insufficient documentation

## 2016-07-09 DIAGNOSIS — R55 Syncope and collapse: Secondary | ICD-10-CM | POA: Insufficient documentation

## 2016-07-09 LAB — CBC WITH DIFFERENTIAL/PLATELET
BASOS ABS: 0 10*3/uL (ref 0.0–0.1)
Basophils Relative: 0 %
Eosinophils Absolute: 0 10*3/uL (ref 0.0–0.7)
Eosinophils Relative: 0 %
HCT: 39 % (ref 36.0–46.0)
HEMOGLOBIN: 13.1 g/dL (ref 12.0–15.0)
LYMPHS PCT: 26 %
Lymphs Abs: 2.7 10*3/uL (ref 0.7–4.0)
MCH: 29.9 pg (ref 26.0–34.0)
MCHC: 33.6 g/dL (ref 30.0–36.0)
MCV: 89 fL (ref 78.0–100.0)
MONOS PCT: 7 %
Monocytes Absolute: 0.7 10*3/uL (ref 0.1–1.0)
Neutro Abs: 6.8 10*3/uL (ref 1.7–7.7)
Neutrophils Relative %: 67 %
Platelets: 266 10*3/uL (ref 150–400)
RBC: 4.38 MIL/uL (ref 3.87–5.11)
RDW: 13 % (ref 11.5–15.5)
WBC: 10.2 10*3/uL (ref 4.0–10.5)

## 2016-07-09 LAB — BASIC METABOLIC PANEL
ANION GAP: 14 (ref 5–15)
BUN: 16 mg/dL (ref 6–20)
CHLORIDE: 105 mmol/L (ref 101–111)
CO2: 21 mmol/L — ABNORMAL LOW (ref 22–32)
Calcium: 10.4 mg/dL — ABNORMAL HIGH (ref 8.9–10.3)
Creatinine, Ser: 0.76 mg/dL (ref 0.44–1.00)
GFR calc non Af Amer: >60 mL/min (ref >60)
Glucose, Bld: 81 mg/dL (ref 65–99)
Potassium: 4.4 mmol/L (ref 3.5–5.1)
Sodium: 140 mmol/L (ref 135–145)

## 2016-07-09 LAB — I-STAT BETA HCG BLOOD, ED (MC, WL, AP ONLY): I-stat hCG, quantitative: <5 m[IU]/mL (ref <5)

## 2016-07-09 LAB — D-DIMER, QUANTITATIVE: D-Dimer, Quant: <0.27 ug/mL-FEU (ref 0.00–0.50)

## 2016-07-09 NOTE — ED Provider Notes (Signed)
[facility]-EMERGENCY DEPT Provider Note   CSN: [id number] Arrival date & time: 07/09/16  1119     History   Chief Complaint Chief Complaint  Patient presents with  . Shortness of Breath    HPI Katherine Hess is a 21 y.o. female who presents for presyncope. Patient states that she was at work. She developed a dull headache and then States that suddenly she felt dizzy like she was going to pass out. She sat down and take some water. Then she noticed that her whole body felt like it had paresthesia. Patient states that this lasted about 3 hours. She was noted to be hyperventilating upon arrival and had carpal spasms. Patient denies feeling like she was panicked, but states that she just felt very short of breath. She denies unilateral leg swelling or calf pain. The patient denies hx of the same, racing or skipping in her heart, recent illness.    HPI  History reviewed. No pertinent past medical history.  There are no active problems to display for this patient.   History reviewed. No pertinent surgical history.  OB History    No data available       Home Medications    Prior to Admission medications   Medication Sig Start Date End Date Taking? Authorizing Provider  ibuprofen (ADVIL,MOTRIN) 600 MG tablet Take 1 tablet (600 mg total) by mouth every 6 (six) hours as needed. 06/15/14   Renne CriglerJoshua Geiple, PA-C    Family History No family history on file.  Social History Social History  Substance Use Topics  . Smoking status: Never Smoker  . Smokeless tobacco: Never Used  . Alcohol use No     Allergies   Patient has no known allergies.   Review of Systems Review of Systems  Ten systems reviewed and are negative for acute change, except as noted in the HPI.   Physical Exam Updated Vital Signs BP 123/73 (BP Location: Right Arm)   Pulse 78   Temp 98.4 F (36.9 C) (Oral)   Resp 18   Ht 5\' 6"  (1.676 m)   Wt 88.9 kg   LMP 06/27/2016   SpO2 100%   BMI 31.64 kg/m    Physical Exam Physical Exam  Nursing note and vitals reviewed. Constitutional: She is oriented to person, place, and time. She appears well-developed and well-nourished. No distress.  HENT:  Head: Normocephalic and atraumatic.  Eyes: Conjunctivae normal and EOM are normal. Pupils are equal, round, and reactive to light. No scleral icterus.  Neck: Normal range of motion.  Cardiovascular: Normal rate, regular rhythm and normal heart sounds.  Exam reveals no gallop and no friction rub.   No murmur heard. Pulmonary/Chest: Effort normal and breath sounds normal. No respiratory distress.  Abdominal: Soft. Bowel sounds are normal. She exhibits no distension and no mass. There is no tenderness. There is no guarding.  Neurological: She is alert and oriented to person, place, and time.  Speech is clear and goal oriented, follows commands Major Cranial nerves without deficit, no facial droop Normal strength in upper and lower extremities bilaterally including dorsiflexion and plantar flexion, strong and equal grip strength Sensation normal to light and sharp touch Moves extremities without ataxia, coordination intact Normal finger to nose and rapid alternating movements Neg romberg, no pronator drift Normal gait Normal heel-shin and balance Skin: Skin is warm and dry. She is not diaphoretic.     ED Treatments / Results  Labs (all labs ordered are listed, but only abnormal results are  displayed) Labs Reviewed  BASIC METABOLIC PANEL - Abnormal; Notable for the following:       Result Value   CO2 21 (*)    Calcium 10.4 (*)    All other components within normal limits  CBC WITH DIFFERENTIAL/PLATELET  I-STAT BETA HCG BLOOD, ED ([facility], [facility], [facility] ONLY)    EKG  EKG Interpretation  Date/Time:  Friday July 09 2016 11:32:46 EST Ventricular Rate:  115 PR Interval:  126 QRS Duration: 92 QT Interval:  328 QTC Calculation: 453 R Axis:   88 Text Interpretation:  Sinus tachycardia Otherwise  normal ECG No significant change since last tracing Confirmed by FLOYD MD, DANIEL [phone]) on 07/09/2016 5:28:09 PM       Radiology Dg Chest 2 View  Result Date: 07/09/2016 CLINICAL DATA:  Dyspnea EXAM: CHEST  2 VIEW COMPARISON:  11/03/2015 chest radiograph. FINDINGS: Stable cardiomediastinal silhouette with normal heart size. No pneumothorax. No pleural effusion. Lungs appear clear, with no acute consolidative airspace disease and no pulmonary edema. IMPRESSION: No active cardiopulmonary disease. Electronically Signed   By: Delbert Phenix M.D.   On: 07/09/2016 12:02    Procedures Procedures (including critical care time)  Medications Ordered in ED Medications - No data to display   Initial Impression / Assessment and Plan / ED Course  I have reviewed the triage vital signs and the nursing notes.  Pertinent labs & imaging results that were available during my care of the patient were reviewed by me and considered in my medical decision making (see chart for details).  Clinical Course as of Jul 10 1815  Fri Jul 09, 2016  1810 Patient labs reviewed and are reassuring. I believe  some of the patient's sxs can be attributed to hyperventilation, however given her sinus tachycardia on EKG- I will get a d-dimer. Orthostatic vitals are pending.  [AH]    Clinical Course User Index [AH] Arthor Captain, PA-C    patient work up reassuring. Negative D-Dimer.  She appears safe for discharge at this time.   Final Clinical Impressions(s) / ED Diagnoses   Final diagnoses:  Pre-syncope  Hyperventilation syndrome    New Prescriptions New Prescriptions   No medications on file     Arthor Captain, PA-C 07/09/16 2019    Melene Plan, DO 07/09/16 2259

## 2016-07-09 NOTE — ED Triage Notes (Signed)
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  She also c/o a headache.  VS stable.  Pt hyperventilating.  States she feels better after O2 placed in triage.

## 2016-07-09 NOTE — Discharge Instructions (Signed)
Your labs and work up are negative.  It does not appear that you have an emergent cause of your symptoms. Please read the information about your diagnosis. Be sure to and drink plenty of fluids to keep your energy up during work.   Get help right away if:  You have a severe headache.  You have unusual pain in your chest, abdomen, or back.  You are bleeding from your mouth or rectum, or you have black or tarry stool.  You have a very fast or irregular heartbeat (palpitations).  You faint once or repeatedly.  You have a seizure.  You are confused.  You have trouble walking.  You have severe weakness.  You have vision problems.

## 2016-08-30 ENCOUNTER — Inpatient Hospital Stay (HOSPITAL_COMMUNITY): Payer: Self-pay

## 2016-08-30 ENCOUNTER — Encounter (HOSPITAL_COMMUNITY): Payer: Self-pay | Admitting: *Deleted

## 2016-08-30 ENCOUNTER — Inpatient Hospital Stay (HOSPITAL_COMMUNITY)
Admission: AD | Admit: 2016-08-30 | Discharge: 2016-08-30 | Disposition: A | Discharge status: Home / Self Care | Payer: Self-pay | Source: Ambulatory Visit | Attending: Obstetrics & Gynecology | Admitting: Obstetrics & Gynecology

## 2016-08-30 DIAGNOSIS — R51 Headache: Secondary | ICD-10-CM | POA: Insufficient documentation

## 2016-08-30 DIAGNOSIS — Z79899 Other long term (current) drug therapy: Secondary | ICD-10-CM | POA: Insufficient documentation

## 2016-08-30 DIAGNOSIS — Z3490 Encounter for supervision of normal pregnancy, unspecified, unspecified trimester: Secondary | ICD-10-CM

## 2016-08-30 DIAGNOSIS — O26892 Other specified pregnancy related conditions, second trimester: Secondary | ICD-10-CM | POA: Insufficient documentation

## 2016-08-30 DIAGNOSIS — Z3A09 9 weeks gestation of pregnancy: Secondary | ICD-10-CM | POA: Insufficient documentation

## 2016-08-30 DIAGNOSIS — R102 Pelvic and perineal pain: Secondary | ICD-10-CM | POA: Insufficient documentation

## 2016-08-30 DIAGNOSIS — O26891 Other specified pregnancy related conditions, first trimester: Secondary | ICD-10-CM

## 2016-08-30 DIAGNOSIS — O208 Other hemorrhage in early pregnancy: Secondary | ICD-10-CM | POA: Insufficient documentation

## 2016-08-30 LAB — CBC
HEMATOCRIT: 36 % (ref 36.0–46.0)
HEMOGLOBIN: 12.5 g/dL (ref 12.0–15.0)
MCH: 30.6 pg (ref 26.0–34.0)
MCHC: 34.7 g/dL (ref 30.0–36.0)
MCV: 88.2 fL (ref 78.0–100.0)
Platelets: 236 10*3/uL (ref 150–400)
RBC: 4.08 MIL/uL (ref 3.87–5.11)
RDW: 13.2 % (ref 11.5–15.5)
WBC: 11.3 10*3/uL — AB (ref 4.0–10.5)

## 2016-08-30 LAB — WET PREP, GENITAL
CLUE CELLS WET PREP: NONE SEEN
SPERM: NONE SEEN
TRICH WET PREP: NONE SEEN
YEAST WET PREP: NONE SEEN

## 2016-08-30 LAB — URINALYSIS, ROUTINE W REFLEX MICROSCOPIC
Bilirubin Urine: NEGATIVE
Glucose, UA: NEGATIVE mg/dL
Hgb urine dipstick: NEGATIVE
Ketones, ur: 20 mg/dL — AB
Nitrite: NEGATIVE
PH: 5 (ref 5.0–8.0)
Protein, ur: NEGATIVE mg/dL
Specific Gravity, Urine: 1.017 (ref 1.005–1.030)

## 2016-08-30 LAB — POCT PREGNANCY, URINE: Preg Test, Ur: POSITIVE — AB

## 2016-08-30 LAB — HCG, QUANTITATIVE, PREGNANCY: hCG, Beta Chain, Quant, S: 120271 m[IU]/mL — ABNORMAL HIGH (ref <5)

## 2016-08-30 MED ORDER — BUTALBITAL-APAP-CAFFEINE 50-325-40 MG PO TABS
2.0000 | ORAL_TABLET | Freq: Once | ORAL | Status: AC
  Administered 2016-08-30: 2 via ORAL
  Filled 2016-08-30: qty 2

## 2016-08-30 NOTE — MAU Provider Note (Signed)
History     CSN: [id number]  Arrival date and time: 08/30/16 1830   First Provider Initiated Contact with Patient 08/30/16 2002      Chief Complaint  Patient presents with  . Headache  . Pelvic Pain   Headache   This is a new problem. The current episode started yesterday. The problem occurs constantly. The problem has been unchanged. The pain is located in the parietal region. The pain does not radiate. The pain quality is similar to prior headaches. The pain is at a severity of 9/10. Associated symptoms include vomiting. Pertinent negatives include no fever or nausea. Nothing aggravates the symptoms. She has tried acetaminophen for the symptoms. The treatment provided no relief.  Pelvic Pain  The patient's primary symptoms include pelvic pain. The patient's pertinent negatives include no vaginal discharge. This is a new problem. The current episode started in the past 7 days. The problem occurs constantly. The problem has been unchanged. Pain severity now: 10/10  The problem affects both sides. She is pregnant. Associated symptoms include dysuria, headaches and vomiting. Pertinent negatives include no chills, fever, frequency, nausea or urgency. The vaginal discharge was normal. There has been no bleeding. Nothing aggravates the symptoms. She has tried nothing for the symptoms. Her menstrual history has been regular (LMP 06/27/16 approx).     History reviewed. No pertinent past medical history.  History reviewed. No pertinent surgical history.  History reviewed. No pertinent family history.  Social History  Substance Use Topics  . Smoking status: Never Smoker  . Smokeless tobacco: Never Used  . Alcohol use No    Allergies: No Known Allergies  Prescriptions Prior to Admission  Medication Sig Dispense Refill Last Dose  . ibuprofen (ADVIL,MOTRIN) 600 MG tablet Take 1 tablet (600 mg total) by mouth every 6 (six) hours as needed. 20 tablet 0     Review of Systems   Constitutional: Negative for chills and fever.  Gastrointestinal: Positive for vomiting. Negative for nausea.  Genitourinary: Positive for dysuria and pelvic pain. Negative for frequency, urgency, vaginal bleeding and vaginal discharge.  Neurological: Positive for headaches.   Physical Exam   Blood pressure 117/73, pulse 98, temperature 98.7 F (37.1 C), temperature source Oral, resp. rate 16, height  (1.676 m), weight 202 lb (91.6 kg), last menstrual period 06/27/2016.  Physical Exam  Nursing note and vitals reviewed. Constitutional: She is oriented to person, place, and time. She appears well-developed and well-nourished. No distress.  HENT:  Head: Normocephalic.  Cardiovascular: Normal rate.   Respiratory: Effort normal.  GI: Soft. There is no tenderness. There is no rebound.  Neurological: She is alert and oriented to person, place, and time.  Skin: Skin is warm and dry.  Psychiatric: She has a normal mood and affect.     Results for orders placed or performed during the hospital encounter of 08/30/16 (from the past 24 hour(s))  Urinalysis, Routine w reflex microscopic     Status: Abnormal   Collection Time: 08/30/16  7:01 PM  Result Value Ref Range   Color, Urine YELLOW YELLOW   APPearance HAZY (A) CLEAR   Specific Gravity, Urine 1.017 1.005 - 1.030   pH 5.0 5.0 - 8.0   Glucose, UA NEGATIVE NEGATIVE mg/dL   Hgb urine dipstick NEGATIVE NEGATIVE   Bilirubin Urine NEGATIVE NEGATIVE   Ketones, ur 20 (A) NEGATIVE mg/dL   Protein, ur NEGATIVE NEGATIVE mg/dL   Nitrite NEGATIVE NEGATIVE   Leukocytes, UA TRACE (A) NEGATIVE   RBC /  HPF 0-5 0 - 5 RBC/hpf   WBC, UA 0-5 0 - 5 WBC/hpf   Bacteria, UA RARE (A) NONE SEEN   Squamous Epithelial / LPF 6-30 (A) NONE SEEN   Mucous PRESENT   Pregnancy, urine POC     Status: Abnormal   Collection Time: 08/30/16  7:38 PM  Result Value Ref Range   Preg Test, Ur POSITIVE (A) NEGATIVE  Wet prep, genital     Status: Abnormal    Collection Time: 08/30/16  8:13 PM  Result Value Ref Range   Yeast Wet Prep HPF POC NONE SEEN NONE SEEN   Trich, Wet Prep NONE SEEN NONE SEEN   Clue Cells Wet Prep HPF POC NONE SEEN NONE SEEN   WBC, Wet Prep HPF POC FEW (A) NONE SEEN   Sperm NONE SEEN   CBC     Status: Abnormal   Collection Time: 08/30/16  8:21 PM  Result Value Ref Range   WBC 11.3 (H) 4.0 - 10.5 K/uL   RBC 4.08 3.87 - 5.11 MIL/uL   Hemoglobin 12.5 12.0 - 15.0 g/dL   HCT 16.1 09.6 - 04.5 %   MCV 88.2 78.0 - 100.0 fL   MCH 30.6 26.0 - 34.0 pg   MCHC 34.7 30.0 - 36.0 g/dL   RDW 40.9 81.1 - 91.4 %   Platelets 236 150 - 400 K/uL  hCG, quantitative, pregnancy     Status: Abnormal   Collection Time: 08/30/16  8:21 PM  Result Value Ref Range   hCG, Beta Chain, Quant, S 120,271 (H) <5 mIU/mL  ABO/Rh     Status: None (Preliminary result)   Collection Time: 08/30/16  8:21 PM  Result Value Ref Range   ABO/RH(D) O POS    US Ob Comp Less 14 Wks  Result Date: 08/30/2016 CLINICAL DATA:  Lower abdominal cramping for 2 days EXAM: OBSTETRIC <14 WK ULTRASOUND TECHNIQUE: Transabdominal ultrasound was performed for evaluation of the gestation as well as the maternal uterus and adnexal regions. COMPARISON:  None. FINDINGS: Intrauterine gestational sac: Single intrauterine gestation Yolk sac:  Visualized Embryo:  Visualized Cardiac Activity: Visualized Heart Rate: 168 bpm CRL:   29.3  mm   9 w 5 d                  [place] EDC: 03/30/2017 Subchorionic hemorrhage: Small subchorionic hemorrhage along the posterior aspect of the sac. Maternal uterus/adnexae: The ovaries are within normal limits. The left ovary measures 3.9 x 2.2 x 2.3 cm. The right ovary measures 3 x 1.5 x 2.1 cm. No significant free fluid. IMPRESSION: Single viable intrauterine gestation as above. Small subchorionic hemorrhage. Electronically Signed   By: Jasmine Pang M.D.   On: 08/30/2016 21:11    MAU Course  Procedures  MDM   Assessment and Plan   1. Headache in  pregnancy, antepartum, first trimester   2. Pelvic pain in pregnancy, antepartum, first trimester   3. Intrauterine pregnancy   4. [redacted] weeks gestation of pregnancy    DC home Comfort measures reviewed  1st Trimester precautions  Bleeding precautions RX: none  Return to MAU as needed FU with OB as planned  Follow-up Information    [facility] Follow up.   Contact information: [address] [place] [address] [phone]            Tawnya Crook 08/30/2016, 8:08 PM

## 2016-08-30 NOTE — Discharge Instructions (Signed)
First Trimester of Pregnancy The first trimester of pregnancy is from week 1 until the end of week 13 (months 1 through 3). A week after a sperm fertilizes an egg, the egg will implant on the wall of the uterus. This embryo will begin to develop into a baby. Genes from you and your partner will form the baby. The female genes will determine whether the baby will be a boy or a girl. At 6-8 weeks, the eyes and face will be formed, and the heartbeat can be seen on ultrasound. At the end of 12 weeks, all the baby's organs will be formed. Now that you are pregnant, you will want to do everything you can to have a healthy baby. Two of the most important things are to get good prenatal care and to follow your health care provider's instructions. Prenatal care is all the medical care you receive before the baby's birth. This care will help prevent, find, and treat any problems during the pregnancy and childbirth. Body changes during your first trimester Your body goes through many changes during pregnancy. The changes vary from woman to woman.  You may gain or lose a couple of pounds at first.  You may feel sick to your stomach (nauseous) and you may throw up (vomit). If the vomiting is uncontrollable, call your health care provider.  You may tire easily.  You may develop headaches that can be relieved by medicines. All medicines should be approved by your health care provider.  You may urinate more often. Painful urination may mean you have a bladder infection.  You may develop heartburn as a result of your pregnancy.  You may develop constipation because certain hormones are causing the muscles that push stool through your intestines to slow down.  You may develop hemorrhoids or swollen veins (varicose veins).  Your breasts may begin to grow larger and become tender. Your nipples may stick out more, and the tissue that surrounds them (areola) may become darker.  Your gums may bleed and may be  sensitive to brushing and flossing.  Dark spots or blotches (chloasma, mask of pregnancy) may develop on your face. This will likely fade after the baby is born.  Your menstrual periods will stop.  You may have a loss of appetite.  You may develop cravings for certain kinds of food.  You may have changes in your emotions from day to day, such as being excited to be pregnant or being concerned that something may go wrong with the pregnancy and baby.  You may have more vivid and strange dreams.  You may have changes in your hair. These can include thickening of your hair, rapid growth, and changes in texture. Some women also have hair loss during or after pregnancy, or hair that feels dry or thin. Your hair will most likely return to normal after your baby is born.  What to expect at prenatal visits During a routine prenatal visit:  You will be weighed to make sure you and the baby are growing normally.  Your blood pressure will be taken.  Your abdomen will be measured to track your baby's growth.  The fetal heartbeat will be listened to between weeks 10 and 14 of your pregnancy.  Test results from any previous visits will be discussed.  Your health care provider may ask you:  How you are feeling.  If you are feeling the baby move.  If you have had any abnormal symptoms, such as leaking fluid, bleeding, severe headaches,   or abdominal cramping.  If you are using any tobacco products, including cigarettes, chewing tobacco, and electronic cigarettes.  If you have any questions.  Other tests that may be performed during your first trimester include:  Blood tests to find your blood type and to check for the presence of any previous infections. The tests will also be used to check for low iron levels (anemia) and protein on red blood cells (Rh antibodies). Depending on your risk factors, or if you previously had diabetes during pregnancy, you may have tests to check for high blood  sugar that affects pregnant women (gestational diabetes).  Urine tests to check for infections, diabetes, or protein in the urine.  An ultrasound to confirm the proper growth and development of the baby.  Fetal screens for spinal cord problems (spina bifida) and Down syndrome.  HIV (human immunodeficiency virus) testing. Routine prenatal testing includes screening for HIV, unless you choose not to have this test.  You may need other tests to make sure you and the baby are doing well.  Follow these instructions at home: Medicines  Follow your health care provider's instructions regarding medicine use. Specific medicines may be either safe or unsafe to take during pregnancy.  Take a prenatal vitamin that contains at least 600 micrograms (mcg) of folic acid.  If you develop constipation, try taking a stool softener if your health care provider approves. Eating and drinking  Eat a balanced diet that includes fresh fruits and vegetables, whole grains, good sources of protein such as meat, eggs, or tofu, and low-fat dairy. Your health care provider will help you determine the amount of weight gain that is right for you.  Avoid raw meat and uncooked cheese. These carry germs that can cause birth defects in the baby.  Eating four or five small meals rather than three large meals a day may help relieve nausea and vomiting. If you start to feel nauseous, eating a few soda crackers can be helpful. Drinking liquids between meals, instead of during meals, also seems to help ease nausea and vomiting.  Limit foods that are high in fat and processed sugars, such as fried and sweet foods.  To prevent constipation: ? Eat foods that are high in fiber, such as fresh fruits and vegetables, whole grains, and beans. ? Drink enough fluid to keep your urine clear or pale yellow. Activity  Exercise only as directed by your health care provider. Most women can continue their usual exercise routine during  pregnancy. Try to exercise for 30 minutes at least 5 days a week. Exercising will help you: ? Control your weight. ? Stay in shape. ? Be prepared for labor and delivery.  Experiencing pain or cramping in the lower abdomen or lower back is a good sign that you should stop exercising. Check with your health care provider before continuing with normal exercises.  Try to avoid standing for long periods of time. Move your legs often if you must stand in one place for a long time.  Avoid heavy lifting.  Wear low-heeled shoes and practice good posture.  You may continue to have sex unless your health care provider tells you not to. Relieving pain and discomfort  Wear a good support bra to relieve breast tenderness.  Take warm sitz baths to soothe any pain or discomfort caused by hemorrhoids. Use hemorrhoid cream if your health care provider approves.  Rest with your legs elevated if you have leg cramps or low back pain.  If you develop   varicose veins in your legs, wear support hose. Elevate your feet for 15 minutes, 3-4 times a day. Limit salt in your diet. Prenatal care  Schedule your prenatal visits by the twelfth week of pregnancy. They are usually scheduled monthly at first, then more often in the last 2 months before delivery.  Write down your questions. Take them to your prenatal visits.  Keep all your prenatal visits as told by your health care provider. This is important. Safety  Wear your seat belt at all times when driving.  Make a list of emergency phone numbers, including numbers for family, friends, the hospital, and police and fire departments. General instructions  Ask your health care provider for a referral to a local prenatal education class. Begin classes no later than the beginning of month 6 of your pregnancy.  Ask for help if you have counseling or nutritional needs during pregnancy. Your health care provider can offer advice or refer you to specialists for help  with various needs.  Do not use hot tubs, steam rooms, or saunas.  Do not douche or use tampons or scented sanitary pads.  Do not cross your legs for long periods of time.  Avoid cat litter boxes and soil used by cats. These carry germs that can cause birth defects in the baby and possibly loss of the fetus by miscarriage or stillbirth.  Avoid all smoking, herbs, alcohol, and medicines not prescribed by your health care provider. Chemicals in these products affect the formation and growth of the baby.  Do not use any products that contain nicotine or tobacco, such as cigarettes and e-cigarettes. If you need help quitting, ask your health care provider. You may receive counseling support and other resources to help you quit.  Schedule a dentist appointment. At home, brush your teeth with a soft toothbrush and be gentle when you floss. Contact a health care provider if:  You have dizziness.  You have mild pelvic cramps, pelvic pressure, or nagging pain in the abdominal area.  You have persistent nausea, vomiting, or diarrhea.  You have a bad smelling vaginal discharge.  You have pain when you urinate.  You notice increased swelling in your face, hands, legs, or ankles.  You are exposed to fifth disease or chickenpox.  You are exposed to German measles (rubella) and have never had it. Get help right away if:  You have a fever.  You are leaking fluid from your vagina.  You have spotting or bleeding from your vagina.  You have severe abdominal cramping or pain.  You have rapid weight gain or loss.  You vomit blood or material that looks like coffee grounds.  You develop a severe headache.  You have shortness of breath.  You have any kind of trauma, such as from a fall or a car accident. Summary  The first trimester of pregnancy is from week 1 until the end of week 13 (months 1 through 3).  Your body goes through many changes during pregnancy. The changes vary from  woman to woman.  You will have routine prenatal visits. During those visits, your health care provider will examine you, discuss any test results you may have, and talk with you about how you are feeling. This information is not intended to replace advice given to you by your health care provider. Make sure you discuss any questions you have with your health care provider. Document Released: 05/11/2001 Document Revised: 04/28/2016 Document Reviewed: 04/28/2016 Elsevier Interactive Patient Education  2017 [employer]  

## 2016-08-30 NOTE — MAU Note (Signed)
Pt C/O Ha since last night, lower abd pressure x 3 days, denies bleeding.  Has urinary frequency, denies dysuria.  Pos HPT x 2 two weeks ago.  Vomiting started this morning, denies diarrhea.

## 2016-08-31 LAB — GC/CHLAMYDIA PROBE AMP (~~LOC~~) NOT AT ARMC
CHLAMYDIA, DNA PROBE: NEGATIVE
Neisseria Gonorrhea: NEGATIVE

## 2016-08-31 LAB — RPR: RPR: NONREACTIVE

## 2016-08-31 LAB — HIV ANTIBODY (ROUTINE TESTING W REFLEX): HIV Screen 4th Generation wRfx: NONREACTIVE

## 2016-08-31 LAB — ABO/RH: ABO/RH(D): O POS

## 2016-09-13 ENCOUNTER — Encounter (HOSPITAL_COMMUNITY): Payer: Self-pay

## 2016-09-13 ENCOUNTER — Inpatient Hospital Stay (HOSPITAL_COMMUNITY)
Admission: AD | Admit: 2016-09-13 | Discharge: 2016-09-14 | Disposition: A | Discharge status: Home / Self Care | Payer: Self-pay | Source: Ambulatory Visit | Attending: Obstetrics and Gynecology | Admitting: Obstetrics and Gynecology

## 2016-09-13 DIAGNOSIS — Z3A11 11 weeks gestation of pregnancy: Secondary | ICD-10-CM | POA: Insufficient documentation

## 2016-09-13 DIAGNOSIS — O219 Vomiting of pregnancy, unspecified: Secondary | ICD-10-CM | POA: Insufficient documentation

## 2016-09-13 LAB — URINALYSIS, ROUTINE W REFLEX MICROSCOPIC
BILIRUBIN URINE: NEGATIVE
Glucose, UA: NEGATIVE mg/dL
HGB URINE DIPSTICK: NEGATIVE
Ketones, ur: NEGATIVE mg/dL
Leukocytes, UA: NEGATIVE
Nitrite: NEGATIVE
PH: 6 (ref 5.0–8.0)
Protein, ur: NEGATIVE mg/dL
SPECIFIC GRAVITY, URINE: 1.006 (ref 1.005–1.030)

## 2016-09-13 NOTE — MAU Note (Signed)
Pt reports nausea and vomiting that started today. States she has not been able to keep anything down. Does not have any nausea medication. Pt reports lower abdominal cramping and pain with urination.

## 2016-09-14 DIAGNOSIS — O219 Vomiting of pregnancy, unspecified: Secondary | ICD-10-CM

## 2016-09-14 MED ORDER — PROMETHAZINE HCL 25 MG PO TABS: 12.5000 mg | ORAL_TABLET | Freq: Four times a day (QID) | ORAL | 0 refills | Status: DC | PRN

## 2016-09-14 MED ORDER — PROMETHAZINE HCL 25 MG/ML IJ SOLN
25.0000 mg | Freq: Once | INTRAMUSCULAR | Status: AC
  Administered 2016-09-14: 25 mg via INTRAMUSCULAR
  Filled 2016-09-14: qty 1

## 2016-09-14 NOTE — Discharge Instructions (Signed)

## 2016-09-14 NOTE — MAU Provider Note (Signed)
  History     CSN: [id number]  Arrival date and time: 09/13/16 2330   First Provider Initiated Contact with Patient 09/14/16 0025      Chief Complaint  Patient presents with  . Katherine Hess   Katherine Hess is a 21 y.o. G2P1001 at  [redacted]w[redacted]d who presents today with nausea and vomiting. She denies any pelvic pain, vaginal bleeding or vaginal discharge.    Katherine Hess   This is a new problem. The current episode started today. The problem occurs 5 to 10 times per day. The problem has been unchanged. The Katherine Hess has an appearance of stomach contents. There has been no fever. Pertinent negatives include no chills, diarrhea or fever. Risk factors: pregnancy  She has tried nothing for the symptoms.   History reviewed. No pertinent past medical history.  History reviewed. No pertinent surgical history.  History reviewed. No pertinent family history.  Social History  Substance Use Topics  . Smoking status: Never Smoker  . Smokeless tobacco: Never Used  . Alcohol use No    Allergies: No Known Allergies  No prescriptions prior to admission.    Review of Systems  Constitutional: Negative for chills and fever.  Gastrointestinal: Positive for nausea and vomiting. Negative for diarrhea.  Genitourinary: Positive for dysuria. Negative for frequency, urgency, vaginal bleeding and vaginal discharge.   Physical Exam   Blood pressure 125/74, pulse 100, temperature 99.1 F (37.3 C), resp. rate 20, height 5' 6.5" (1.689 m), weight 208 lb (94.3 kg), last menstrual period 06/27/2016, SpO2 98 %.  Physical Exam  Nursing note and vitals reviewed. Constitutional: She is oriented to person, place, and time. She appears well-developed and well-nourished. No distress.  HENT:  Head: Normocephalic.  Cardiovascular: Normal rate.   Respiratory: Effort normal.  GI: Soft. There is no tenderness. There is no rebound.  Neurological: She is alert and oriented to person, place, and time.  Skin: Skin is warm and dry.   Psychiatric: She has a normal mood and affect.     Results for orders placed or performed during the hospital encounter of 09/13/16 (from the past 24 hour(s))  Urinalysis, Routine w reflex microscopic     Status: None   Collection Time: 09/13/16 11:40 PM  Result Value Ref Range   Color, Urine YELLOW YELLOW   APPearance CLEAR CLEAR   Specific Gravity, Urine 1.006 1.005 - 1.030   pH 6.0 5.0 - 8.0   Glucose, UA NEGATIVE NEGATIVE mg/dL   Hgb urine dipstick NEGATIVE NEGATIVE   Bilirubin Urine NEGATIVE NEGATIVE   Ketones, ur NEGATIVE NEGATIVE mg/dL   Protein, ur NEGATIVE NEGATIVE mg/dL   Nitrite NEGATIVE NEGATIVE   Leukocytes, UA NEGATIVE NEGATIVE    MAU Course  Procedures  MDM 0116: Patient has had phenergan. She is feeling better and tolerating PO at this time.   Assessment and Plan   1. Nausea/vomiting in pregnancy   2. [redacted] weeks gestation of pregnancy    DC home Comfort measures reviewed  1st/2nd Trimester precautions  RX: phenergan PRN #[id number]  Return to MAU as needed FU with OB as planned  Follow-up Information    [facility] Follow [facility].   Specialty:  Obstetrics and Gynecology Contact information: [address] [place] [address] [phone]           Tawnya Crook 09/14/2016, 12:26 AM

## 2016-09-15 LAB — CULTURE, OB URINE: Culture: NO GROWTH

## 2016-10-14 LAB — OB RESULTS CONSOLE ANTIBODY SCREEN: Antibody Screen: NEGATIVE

## 2016-10-14 LAB — OB RESULTS CONSOLE ABO/RH: RH Type: POSITIVE

## 2016-10-14 LAB — OB RESULTS CONSOLE HIV ANTIBODY (ROUTINE TESTING): HIV: NONREACTIVE

## 2016-10-14 LAB — OB RESULTS CONSOLE GC/CHLAMYDIA
CHLAMYDIA, DNA PROBE: NEGATIVE
GC PROBE AMP, GENITAL: NEGATIVE

## 2016-10-14 LAB — OB RESULTS CONSOLE HEPATITIS B SURFACE ANTIGEN: Hepatitis B Surface Ag: NEGATIVE

## 2016-10-14 LAB — OB RESULTS CONSOLE RPR: RPR: NONREACTIVE

## 2016-10-14 LAB — OB RESULTS CONSOLE RUBELLA ANTIBODY, IGM: Rubella: IMMUNE

## 2016-10-23 ENCOUNTER — Encounter (HOSPITAL_COMMUNITY): Payer: Self-pay | Admitting: *Deleted

## 2016-10-23 ENCOUNTER — Inpatient Hospital Stay (HOSPITAL_COMMUNITY)
Admission: AD | Admit: 2016-10-23 | Discharge: 2016-10-23 | Disposition: A | Discharge status: Home / Self Care | Payer: Medicaid Other | Source: Ambulatory Visit | Attending: Obstetrics and Gynecology | Admitting: Obstetrics and Gynecology

## 2016-10-23 DIAGNOSIS — O9989 Other specified diseases and conditions complicating pregnancy, childbirth and the puerperium: Secondary | ICD-10-CM | POA: Diagnosis not present

## 2016-10-23 DIAGNOSIS — G43019 Migraine without aura, intractable, without status migrainosus: Secondary | ICD-10-CM

## 2016-10-23 DIAGNOSIS — Z3A16 16 weeks gestation of pregnancy: Secondary | ICD-10-CM | POA: Diagnosis not present

## 2016-10-23 DIAGNOSIS — O26892 Other specified pregnancy related conditions, second trimester: Secondary | ICD-10-CM | POA: Insufficient documentation

## 2016-10-23 DIAGNOSIS — R51 Headache: Secondary | ICD-10-CM | POA: Diagnosis present

## 2016-10-23 HISTORY — DX: Unspecified convulsions: R56.9

## 2016-10-23 LAB — URINALYSIS, ROUTINE W REFLEX MICROSCOPIC
Bilirubin Urine: NEGATIVE
GLUCOSE, UA: NEGATIVE mg/dL
Hgb urine dipstick: NEGATIVE
Ketones, ur: NEGATIVE mg/dL
Leukocytes, UA: NEGATIVE
NITRITE: NEGATIVE
PH: 5.5 (ref 5.0–8.0)
Protein, ur: NEGATIVE mg/dL
SPECIFIC GRAVITY, URINE: 1.015 (ref 1.005–1.030)

## 2016-10-23 MED ORDER — METOCLOPRAMIDE HCL 10 MG PO TABS
10.0000 mg | ORAL_TABLET | Freq: Once | ORAL | Status: AC
  Administered 2016-10-23: 10 mg via ORAL
  Filled 2016-10-23: qty 1

## 2016-10-23 MED ORDER — DEXAMETHASONE SODIUM PHOSPHATE 10 MG/ML IJ SOLN
10.0000 mg | Freq: Once | INTRAMUSCULAR | Status: AC
  Administered 2016-10-23: 10 mg via INTRAVENOUS
  Filled 2016-10-23: qty 1

## 2016-10-23 MED ORDER — CYCLOBENZAPRINE HCL 10 MG PO TABS
10.0000 mg | ORAL_TABLET | Freq: Once | ORAL | Status: AC
  Administered 2016-10-23: 10 mg via ORAL
  Filled 2016-10-23: qty 1

## 2016-10-23 MED ORDER — IBUPROFEN 600 MG PO TABS
600.0000 mg | ORAL_TABLET | Freq: Once | ORAL | Status: AC
  Administered 2016-10-23: 600 mg via ORAL
  Filled 2016-10-23: qty 1

## 2016-10-23 MED ORDER — LACTATED RINGERS IV BOLUS (SEPSIS)
1000.0000 mL | Freq: Once | INTRAVENOUS | Status: AC
  Administered 2016-10-23: 1000 mL via INTRAVENOUS

## 2016-10-23 MED ORDER — IBUPROFEN 600 MG PO TABS: 600.0000 mg | ORAL_TABLET | Freq: Three times a day (TID) | ORAL | 0 refills | Status: DC | PRN

## 2016-10-23 MED ORDER — CYCLOBENZAPRINE HCL 10 MG PO TABS: 10.0000 mg | ORAL_TABLET | Freq: Two times a day (BID) | ORAL | 0 refills | Status: DC | PRN

## 2016-10-23 MED ORDER — METOCLOPRAMIDE HCL 10 MG PO TABS: 10.0000 mg | ORAL_TABLET | Freq: Four times a day (QID) | ORAL | 0 refills | Status: DC

## 2016-10-23 MED ORDER — DIPHENHYDRAMINE HCL 50 MG/ML IJ SOLN
12.5000 mg | Freq: Once | INTRAMUSCULAR | Status: AC
  Administered 2016-10-23: 12.5 mg via INTRAVENOUS
  Filled 2016-10-23: qty 1

## 2016-10-23 NOTE — MAU Provider Note (Signed)
History     CSN: [id number]  Arrival date and time: 10/23/16 2024   First Provider Initiated Contact with Patient 10/23/16 2058      Chief Complaint  Patient presents with  . Headache   HPI   Ms.Katherine Hess is a 21 y.o. female G2P1001 @ [redacted]w[redacted]d here in MAU with a headache. Patient states she has had this headache off and on for 3 months. She has only tried tylenol and that is not working. She has been taking 1000 Mg at a time. The headache is at the top of her head. No history of migraines. Denies vaginal bleeding or leaking of fluid.   OB History    Gravida Para Term Preterm AB Living   2 1 1     1    SAB TAB Ectopic Multiple Live Births           1      Past Medical History:  Diagnosis Date  . Seizures (HCC)    when she was a baby    Past Surgical History:  Procedure Laterality Date  . NO PAST SURGERIES      No family history on file.  Social History  Substance Use Topics  . Smoking status: Never Smoker  . Smokeless tobacco: Never Used  . Alcohol use No    Allergies: No Known Allergies  Prescriptions Prior to Admission  Medication Sig Dispense Refill Last Dose  . promethazine (PHENERGAN) 25 MG tablet Take 0.5-1 tablets (12.5-25 mg total) by mouth every 6 (six) hours as needed. 30 tablet 0    Results for orders placed or performed during the hospital encounter of 10/23/16 (from the past 48 hour(s))  Urinalysis, Routine w reflex microscopic     Status: None   Collection Time: 10/23/16  8:30 PM  Result Value Ref Range   Color, Urine YELLOW YELLOW   APPearance CLEAR CLEAR   Specific Gravity, Urine 1.015 1.005 - 1.030   pH 5.5 5.0 - 8.0   Glucose, UA NEGATIVE NEGATIVE mg/dL   Hgb urine dipstick NEGATIVE NEGATIVE   Bilirubin Urine NEGATIVE NEGATIVE   Ketones, ur NEGATIVE NEGATIVE mg/dL   Protein, ur NEGATIVE NEGATIVE mg/dL   Nitrite NEGATIVE NEGATIVE   Leukocytes, UA NEGATIVE NEGATIVE    Comment: Microscopic not done on urines with negative protein,  blood, leukocytes, nitrite, or glucose < 500 mg/dL.   Review of Systems  Eyes: Positive for photophobia.  Gastrointestinal: Positive for nausea. Negative for abdominal pain and vomiting.  Genitourinary: Negative for vaginal bleeding.  Neurological: Positive for headaches.   Physical Exam   Blood pressure 123/72, pulse 88, temperature 98.4 F (36.9 C), temperature source Oral, resp. rate 18, height 5\' 6"  (1.676 m), weight 208 lb (94.3 kg), last menstrual period 06/27/2016, SpO2 99 %.  Physical Exam  Constitutional: She is oriented to person, place, and time. She appears well-developed and well-nourished. No distress.  HENT:  Head: Normocephalic.  Eyes: Pupils are equal, round, and reactive to light.  Musculoskeletal: Normal range of motion.  Neurological: She is alert and oriented to person, place, and time. GCS eye subscore is 4. GCS verbal subscore is 5. GCS motor subscore is 6.  Skin: Skin is warm. She is not diaphoretic.  Psychiatric: Her behavior is normal.   MAU Course  Procedures  None  MDM  Flexeril 5 mg & ibuprofen 600 mg & Reglan 10 mg  PO given: pain down from 10/10 to 7/10. Lr bolus given added Decadron 10 mg IV and  Benadryl 12.5 mg IV given Pain down to 0/10. + fetal heart tones via doppler.   Assessment and Plan   A:  1. 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  Ok to add benadryl PO at home for Migraine management Return to MAU if symptoms worsen  Increase PO fluid intake.    Venia Carbonasch, Stephnie Parlier I, NP 10/24/2016 1:27 AM

## 2016-10-23 NOTE — MAU Note (Signed)
Pt reprots she has been having a headache on and off for the past 2 months. [facility] all they tell her to take is tylenol and it is not helping. Having nausea with the headache now as well.

## 2016-10-23 NOTE — Discharge Instructions (Signed)

## 2016-11-24 ENCOUNTER — Inpatient Hospital Stay (HOSPITAL_COMMUNITY)
Admission: AD | Admit: 2016-11-24 | Discharge: 2016-11-24 | Disposition: A | Discharge status: Home / Self Care | Payer: Medicaid Other | Source: Ambulatory Visit | Attending: Obstetrics and Gynecology | Admitting: Obstetrics and Gynecology

## 2016-11-24 ENCOUNTER — Encounter (HOSPITAL_COMMUNITY): Payer: Self-pay | Admitting: *Deleted

## 2016-11-24 DIAGNOSIS — Z3A21 21 weeks gestation of pregnancy: Secondary | ICD-10-CM | POA: Diagnosis not present

## 2016-11-24 DIAGNOSIS — R102 Pelvic and perineal pain: Secondary | ICD-10-CM | POA: Diagnosis not present

## 2016-11-24 DIAGNOSIS — O9989 Other specified diseases and conditions complicating pregnancy, childbirth and the puerperium: Secondary | ICD-10-CM | POA: Diagnosis not present

## 2016-11-24 DIAGNOSIS — G44209 Tension-type headache, unspecified, not intractable: Secondary | ICD-10-CM

## 2016-11-24 DIAGNOSIS — N949 Unspecified condition associated with female genital organs and menstrual cycle: Secondary | ICD-10-CM

## 2016-11-24 DIAGNOSIS — O99352 Diseases of the nervous system complicating pregnancy, second trimester: Secondary | ICD-10-CM | POA: Insufficient documentation

## 2016-11-24 DIAGNOSIS — O26892 Other specified pregnancy related conditions, second trimester: Secondary | ICD-10-CM | POA: Insufficient documentation

## 2016-11-24 DIAGNOSIS — Z79899 Other long term (current) drug therapy: Secondary | ICD-10-CM | POA: Insufficient documentation

## 2016-11-24 DIAGNOSIS — M549 Dorsalgia, unspecified: Secondary | ICD-10-CM | POA: Diagnosis not present

## 2016-11-24 LAB — URINALYSIS, ROUTINE W REFLEX MICROSCOPIC
Bilirubin Urine: NEGATIVE
Glucose, UA: NEGATIVE mg/dL
HGB URINE DIPSTICK: NEGATIVE
Ketones, ur: NEGATIVE mg/dL
Leukocytes, UA: NEGATIVE
Nitrite: NEGATIVE
PROTEIN: NEGATIVE mg/dL
SPECIFIC GRAVITY, URINE: 1.005 (ref 1.005–1.030)
pH: 6 (ref 5.0–8.0)

## 2016-11-24 MED ORDER — KETOROLAC TROMETHAMINE 30 MG/ML IJ SOLN
30.0000 mg | Freq: Once | INTRAMUSCULAR | Status: AC
  Administered 2016-11-24: 30 mg via INTRAMUSCULAR
  Filled 2016-11-24: qty 1

## 2016-11-24 MED ORDER — BUTALBITAL-APAP-CAFFEINE 50-325-40 MG PO TABS: 1.0000 | ORAL_TABLET | Freq: Four times a day (QID) | ORAL | 0 refills | Status: DC | PRN

## 2016-11-24 NOTE — Discharge Instructions (Signed)
Tension Headache A tension headache is pain, pressure, or aching that is felt over the front and sides of your head. These headaches can last from 30 minutes to several days. Follow these instructions at home: Managing pain  Take over-the-counter and prescription medicines only as told by your doctor.  Lie down in a dark, quiet room when you have a headache.  If directed, apply ice to your head and neck area: ? Put ice in a plastic bag. ? Place a towel between your skin and the bag. ? Leave the ice on for 20 minutes, 2-3 times per day.  Use a heating pad or a hot shower to apply heat to your head and neck area as told by your doctor. Eating and drinking  Eat meals on a regular schedule.  Do not drink a lot of alcohol.  Do not use a lot of caffeine, or stop using caffeine. General instructions  Keep all follow-up visits as told by your doctor. This is important.  Keep a journal to find out if certain things bring on headaches. For example, write down: ? What you eat and drink. ? How much sleep you get. ? Any change to your diet or medicines.  Try getting a massage, or doing other things that help you to relax.  Lessen stress.  Sit up straight. Do not tighten (tense) your muscles.  Do not use tobacco products. This includes cigarettes, chewing tobacco, or e-cigarettes. If you need help quitting, ask your doctor.  Exercise regularly as told by your doctor.  Get enough sleep. This may mean 7-9 hours of sleep. Contact a doctor if:  Your symptoms are not helped by medicine.  You have a headache that feels different from your usual headache.  You feel sick to your stomach (nauseous) or you throw up (vomit).  You have a fever. Get help right away if:  Your headache becomes very bad.  You keep throwing up.  You have a stiff neck.  You have trouble seeing.  You have trouble speaking.  You have pain in your eye or ear.  Your muscles are weak or you lose muscle  control.  You lose your balance or you have trouble walking.  You feel like you will pass out (faint) or you pass out.  You have confusion. This information is not intended to replace advice given to you by your health care provider. Make sure you discuss any questions you have with your health care provider. Document Released: 08/11/2009 Document Revised: 01/15/2016 Document Reviewed: 09/09/2014 Elsevier Interactive Patient Education  2018 Elsevier Inc. Back Pain in Pregnancy Back pain during pregnancy is common. Back pain may be caused by several factors that are related to changes during your pregnancy. Follow these instructions at home: Managing pain, stiffness, and swelling  If directed, apply ice for sudden (acute) back pain. ? Put ice in a plastic bag. ? Place a towel between your skin and the bag. ? Leave the ice on for 20 minutes, 2-3 times per day.  If directed, apply heat to the affected area before you exercise: ? Place a towel between your skin and the heat pack or heating pad. ? Leave the heat on for 20-30 minutes. ? Remove the heat if your skin turns bright red. This is especially important if you are unable to feel pain, heat, or cold. You may have a greater risk of getting burned. Activity  Exercise as told by your health care provider. Exercising is the best way to  prevent or manage back pain.  Listen to your body when lifting. If lifting hurts, ask for help or bend your knees. This uses your leg muscles instead of your back muscles.  Squat down when picking up something from the floor. Do not bend over.  Only use bed rest as told by your health care provider. Bed rest should only be used for the most severe episodes of back pain. Standing, Sitting, and Lying Down  Do not stand in one place for long periods of time.  Use good posture when sitting. Make sure your head rests over your shoulders and is not hanging forward. Use a pillow on your lower back if  necessary.  Try sleeping on your side, preferably the left side, with a pillow or two between your legs. If you are sore after a night's rest, your bed may be too soft. A firm mattress may provide more support for your back during pregnancy. General instructions  Do not wear high heels.  Eat a healthy diet. Try to gain weight within your health care provider's recommendations.  Use a maternity girdle, elastic sling, or back brace as told by your health care provider.  Take over-the-counter and prescription medicines only as told by your health care provider.  Keep all follow-up visits as told by your health care provider. This is important. This includes any visits with any specialists, such as a physical therapist. Contact a health care provider if:  Your back pain interferes with your daily activities.  You have increasing pain in other parts of your body. Get help right away if:  You develop numbness, tingling, weakness, or problems with the use of your arms or legs.  You develop severe back pain that is not controlled with medicine.  You have a sudden change in bowel or bladder control.  You develop shortness of breath, dizziness, or you faint.  You develop nausea, vomiting, or sweating.  You have back pain that is a rhythmic, cramping pain similar to labor pains. Labor pain is usually 1-2 minutes apart, lasts for about 1 minute, and involves a bearing down feeling or pressure in your pelvis.  You have back pain and your water breaks or you have vaginal bleeding.  You have back pain or numbness that travels down your leg.  Your back pain developed after you fell.  You develop pain on one side of your back.  You see blood in your urine.  You develop skin blisters in the area of your back pain. This information is not intended to replace advice given to you by your health care provider. Make sure you discuss any questions you have with your health care  provider. Document Released: 08/25/2005 Document Revised: 10/23/2015 Document Reviewed: 01/29/2015 Elsevier Interactive Patient Education  2018 [employer]. Round Ligament Pain The round ligament is a cord of muscle and tissue that helps to support the uterus. It can become a source of pain during pregnancy if it becomes stretched or twisted as the baby grows. The pain usually begins in the second trimester of pregnancy, and it can come and go until the baby is delivered. It is not a serious problem, and it does not cause harm to the baby. Round ligament pain is usually a short, sharp, and pinching pain, but it can also be a dull, lingering, and aching pain. The pain is felt in the lower side of the abdomen or in the groin. It usually starts deep in the groin and moves up to  the outside of the hip area. Pain can occur with:  A sudden change in position.  Rolling over in bed.  Coughing or sneezing.  Physical activity.  Follow these instructions at home: Watch your condition for any changes. Take these steps to help with your pain:  When the pain starts, relax. Then try: ? Sitting down. ? Flexing your knees up to your abdomen. ? Lying on your side with one pillow under your abdomen and another pillow between your legs. ? Sitting in a warm bath for 15-20 minutes or until the pain goes away.  Take over-the-counter and prescription medicines only as told by your health care provider.  Move slowly when you sit and stand.  Avoid long walks if they cause pain.  Stop or lessen your physical activities if they cause pain.  Contact a health care provider if:  Your pain does not go away with treatment.  You feel pain in your back that you did not have before.  Your medicine is not helping. Get help right away if:  You develop a fever or chills.  You develop uterine contractions.  You develop vaginal bleeding.  You develop nausea or vomiting.  You develop diarrhea.  You have  pain when you urinate. This information is not intended to replace advice given to you by your health care provider. Make sure you discuss any questions you have with your health care provider. Document Released: 02/24/2008 Document Revised: 10/23/2015 Document Reviewed: 07/24/2014 Elsevier Interactive Patient Education  [employer]2018 Elsevier Inc.

## 2016-11-24 NOTE — MAU Provider Note (Signed)
History     CSN: [id number]  Arrival date and time: 11/24/16 1801   First Provider Initiated Contact with Patient 11/24/16 1906      Chief Complaint  Patient presents with  . Headache  . Abdominal Pain  . Back Pain   HPI 21 yo G2P1001 IUP 21 3/7 weeks presents to MAU with c/o headache, back and abd pain. HA has been off/on for several weeks. She has several MAU visit for the same. She reports was doing well with the Motrin given to her but told to stop taking. Afterwards her HA returned. No H/O migraine HA. She denies any visual changes or weakness.   She reports the back pain and abd pain since Sat. Pain is with movement. Denies any vaginal bleeding, ctx, cramps or pressure. No bowel or bladder dysfunction.  Prenatal care at [facility] and has been unremarkable to this point.  Reports good fetal movement.   Past Medical History:  Diagnosis Date  . Seizures (HCC)    when she was a baby    Past Surgical History:  Procedure Laterality Date  . NO PAST SURGERIES      History reviewed. No pertinent family history.  Social History  Substance Use Topics  . Smoking status: Never Smoker  . Smokeless tobacco: Never Used  . Alcohol use No    Allergies: No Known Allergies  Prescriptions Prior to Admission  Medication Sig Dispense Refill Last Dose  . cyclobenzaprine (FLEXERIL) 10 MG tablet Take 1 tablet (10 mg total) by mouth 2 (two) times daily as needed for muscle spasms. 10 tablet 0   . ibuprofen (ADVIL,MOTRIN) 600 MG tablet Take 1 tablet (600 mg total) by mouth every 8 (eight) hours as needed. 10 tablet 0   . metoCLOPramide (REGLAN) 10 MG tablet Take 1 tablet (10 mg total) by mouth every 6 (six) hours. 10 tablet 0   . promethazine (PHENERGAN) 25 MG tablet Take 0.5-1 tablets (12.5-25 mg total) by mouth every 6 (six) hours as needed. 30 tablet 0     Review of Systems  Constitutional: Negative.   HENT: Negative.   Eyes: Negative.   Respiratory: Negative.   Cardiovascular:  Negative.   Gastrointestinal: Positive for abdominal pain.  Genitourinary: Negative.    Physical Exam   Blood pressure 118/73, pulse (!) 114, temperature 98.6 F (37 C), temperature source Oral, resp. rate 17, weight 97.5 kg (215 lb 0.6 oz), last menstrual period 06/27/2016, SpO2 100 %.  Physical Exam  Constitutional: She is oriented to person, place, and time. She appears well-developed and well-nourished.  No acute distress noted  HENT:  Head: Normocephalic and atraumatic.  Eyes: Conjunctivae and EOM are normal. Pupils are equal, round, and reactive to light.  Neck: Normal range of motion. Neck supple.  Cardiovascular: Normal rate and regular rhythm.   Respiratory: Effort normal and breath sounds normal.  GI: Soft. Bowel sounds are normal.  Musculoskeletal: Normal range of motion.  No CVA tenderness, min paraspinal muscle tenderness  Neurological: She is alert and oriented to person, place, and time. She has normal reflexes. No cranial nerve deficit.    MAU Course  Procedures Pt given IM Toradol with resolution of her headache.    Assessment and Plan  IUP 21 3/7 weeks Headache tension Round ligament pain Back pain in pregnancy  Pt provided Tx instructions for ligament and back pain in pregnancy. Fioricet for the HA. If HA continue may need eval fro neurology. In[facility]   Peggyann Zwiefelhofer L Phila Shoaf 11/24/2016, 7:21 PM

## 2016-11-24 NOTE — MAU Note (Signed)
+  lower abdominal and back pain Started Saturday  +headache  Rating pain 10/10  Tried tylenol with no relief; last took this  am

## 2017-01-04 ENCOUNTER — Inpatient Hospital Stay (HOSPITAL_COMMUNITY)
Admission: AD | Admit: 2017-01-04 | Discharge: 2017-01-04 | Disposition: A | Discharge status: Home / Self Care | Payer: Medicaid Other | Source: Ambulatory Visit | Attending: Family Medicine | Admitting: Family Medicine

## 2017-01-04 ENCOUNTER — Encounter (HOSPITAL_COMMUNITY): Payer: Self-pay | Admitting: *Deleted

## 2017-01-04 DIAGNOSIS — R51 Headache: Secondary | ICD-10-CM | POA: Diagnosis not present

## 2017-01-04 DIAGNOSIS — O9989 Other specified diseases and conditions complicating pregnancy, childbirth and the puerperium: Secondary | ICD-10-CM

## 2017-01-04 DIAGNOSIS — R109 Unspecified abdominal pain: Secondary | ICD-10-CM | POA: Diagnosis not present

## 2017-01-04 DIAGNOSIS — M549 Dorsalgia, unspecified: Secondary | ICD-10-CM | POA: Insufficient documentation

## 2017-01-04 DIAGNOSIS — O26899 Other specified pregnancy related conditions, unspecified trimester: Secondary | ICD-10-CM | POA: Diagnosis not present

## 2017-01-04 DIAGNOSIS — O99891 Other specified diseases and conditions complicating pregnancy: Secondary | ICD-10-CM

## 2017-01-04 DIAGNOSIS — Z3A27 27 weeks gestation of pregnancy: Secondary | ICD-10-CM | POA: Insufficient documentation

## 2017-01-04 DIAGNOSIS — O26892 Other specified pregnancy related conditions, second trimester: Secondary | ICD-10-CM | POA: Diagnosis not present

## 2017-01-04 MED ORDER — ACETAMINOPHEN 500 MG PO TABS
1000.0000 mg | ORAL_TABLET | Freq: Once | ORAL | Status: AC
  Administered 2017-01-04: 1000 mg via ORAL
  Filled 2017-01-04: qty 2

## 2017-01-04 MED ORDER — COMFORT FIT MATERNITY SUPP LG MISC: 0 refills | Status: DC

## 2017-01-04 MED ORDER — METOCLOPRAMIDE HCL 10 MG PO TABS
10.0000 mg | ORAL_TABLET | Freq: Once | ORAL | Status: AC
  Administered 2017-01-04: 10 mg via ORAL
  Filled 2017-01-04: qty 1

## 2017-01-04 NOTE — Discharge Instructions (Signed)
Follow-up with health Department and ask them to refer you to headache specialist Support band prescription given    Abdominal Pain During Pregnancy Belly (abdominal) pain is common during pregnancy. Most of the time, it is not a serious problem. Other times, it can be a sign that something is wrong with the pregnancy. Always tell your doctor if you have belly pain. Follow these instructions at home: Monitor your belly pain for any changes. The following actions may help you feel better:  Do not have sex (intercourse) or put anything in your vagina until you feel better.  Rest until your pain stops.  Drink clear fluids if you feel sick to your stomach (nauseous). Do not eat solid food until you feel better.  Only take medicine as told by your doctor.  Keep all doctor visits as told.  Get help right away if:  You are bleeding, leaking fluid, or pieces of tissue come out of your vagina.  You have more pain or cramping.  You keep throwing up (vomiting).  You have pain when you pee (urinate) or have blood in your pee.  You have a fever.  You do not feel your baby moving as much.  You feel very weak or feel like passing out.  You have trouble breathing, with or without belly pain.  You have a very bad headache and belly pain.  You have fluid leaking from your vagina and belly pain.  You keep having watery poop (diarrhea).  Your belly pain does not go away after resting, or the pain gets worse. This information is not intended to replace advice given to you by your health care provider. Make sure you discuss any questions you have with your health care provider. Document Released: 05/05/2009 Document Revised: 12/24/2015 Document Reviewed: 12/14/2012 Elsevier Interactive Patient Education  [employer]2018 Elsevier Inc.

## 2017-01-04 NOTE — MAU Provider Note (Signed)
Chief Complaint:  Shortness of Breath; Headache; and Abdominal Pain   First Provider Initiated Contact with Patient 01/04/17 1927      HPI: Katherine Hess is a 21 y.o. G2P1001 at 4725w2d who presents to MAU reporting back pain, abdominal pain, and headache. She states that headaches have been chronic this pregnancy. Nothing seems to really help them. She has Rx for Fioricet which she has taken twice today with minimal relief. Currently rates her headache a 7/10. Denies shortness of breath to me. Denies visual changes or weakness.  Denies contractions, leakage of fluid, vaginal discharge, or vaginal bleeding. Good fetal movement.   Pregnancy Course:  Receives care at the health department  Past Medical History: Past Medical History:  Diagnosis Date  . Seizures (HCC)    when she was a baby    Past obstetric history: OB History  Gravida Para Term Preterm AB Living  2 1 1     1   SAB TAB Ectopic Multiple Live Births          1    # Outcome Date GA Lbr Len/2nd Weight Sex Delivery Anes PTL Lv  2 Current           1 Term 2017    F Vag-Spont   LIV      Past Surgical History: Past Surgical History:  Procedure Laterality Date  . NO PAST SURGERIES       Family History: Family History  Problem Relation Age of Onset  . Hypertension Mother   . Diabetes Father   . Hypertension Sister     Social History: Social History  Substance Use Topics  . Smoking status: Never Smoker  . Smokeless tobacco: Never Used  . Alcohol use No    Allergies: No Known Allergies  Meds:  Prescriptions Prior to Admission  Medication Sig Dispense Refill Last Dose  . butalbital-acetaminophen-caffeine (FIORICET, ESGIC) 50-325-40 MG tablet Take 1-2 tablets by mouth every 6 (six) hours as needed for headache. 20 tablet 0   . cyclobenzaprine (FLEXERIL) 10 MG tablet Take 1 tablet (10 mg total) by mouth 2 (two) times daily as needed for muscle spasms. 10 tablet 0   . metoCLOPramide (REGLAN) 10 MG tablet Take  1 tablet (10 mg total) by mouth every 6 (six) hours. 10 tablet 0   . promethazine (PHENERGAN) 25 MG tablet Take 0.5-1 tablets (12.5-25 mg total) by mouth every 6 (six) hours as needed. 30 tablet 0     I have reviewed patient's Past Medical Hx, Surgical Hx, Family Hx, Social Hx, medications and allergies.   ROS:  All systems reviewed and are negative for acute change except as noted in the HPI.   Physical Exam  Patient Vitals for the past 24 hrs:  BP Temp Temp src Pulse Resp SpO2 Weight  01/04/17 1900 - - - - - 97 % -  01/04/17 1832 114/72 98.7 F (37.1 C) Oral (!) 105 17 99 % -  01/04/17 1819 - - - - - - 223 lb 1.3 oz (101.2 kg)   Constitutional: Well-developed, well-nourished female in no acute distress.  Cardiovascular: normal rate Respiratory: normal effort GI: Abd soft, non-tender, gravid appropriate for gestational age.  MS: Extremities nontender, no edema, normal ROM Neurologic: Alert and oriented x 4. No neurological deficits appreciated. GU: Neg CVAT.   Labs: No results found for this or any previous visit (from the past 24 hour(s)).  Imaging:  No results found.  MAU Course: Neurological exam unremarkable History of chronic  headaches with continuation into pregnancy Given tylenol 1000mg  and Reglan 10mg  in MAU  I personally reviewed the patient's NST today, found to be REACTIVE. No CTXs seen on toco.  MDM: Plan of care reviewed with patient, including labs and tests ordered and medical treatment.   Assessment: 1. Pregnancy headache in second trimester   2. Abdominal pain in pregnancy, antepartum   3. Back pain affecting pregnancy, antepartum     Plan: -Discharge home in stable condition -continue current headache regimen; discussed need to follow up with OB to get referral to headache specialist -Rx given for pregnancy support band -Preterm labor precautions and fetal kick counts -Handout given -Follow-up with OB provider   Caryl Ada, DO OB  [facility], [facility] 01/04/2017 7:32 PM

## 2017-01-04 NOTE — MAU Note (Signed)
+  headache Off and on since sunday  +lower abdominal pain Started yesterday and got worse today Sharp and stabbing in nature; intermittently  Denies vaginal discharge or bleeding  +shortness of breath; states hard to take a deep breath or catch breath at times Started this am when she woke up  +FM

## 2017-01-04 NOTE — MAU Note (Signed)
Urine in the lab  

## 2017-01-29 ENCOUNTER — Encounter (HOSPITAL_COMMUNITY): Payer: Self-pay | Admitting: Obstetrics and Gynecology

## 2017-01-29 ENCOUNTER — Inpatient Hospital Stay (HOSPITAL_COMMUNITY)
Admission: AD | Admit: 2017-01-29 | Discharge: 2017-01-29 | Disposition: A | Discharge status: Home / Self Care | Payer: Medicaid Other | Source: Ambulatory Visit | Attending: Obstetrics & Gynecology | Admitting: Obstetrics & Gynecology

## 2017-01-29 DIAGNOSIS — R51 Headache: Secondary | ICD-10-CM | POA: Diagnosis not present

## 2017-01-29 DIAGNOSIS — O26893 Other specified pregnancy related conditions, third trimester: Secondary | ICD-10-CM | POA: Diagnosis not present

## 2017-01-29 DIAGNOSIS — O9989 Other specified diseases and conditions complicating pregnancy, childbirth and the puerperium: Secondary | ICD-10-CM

## 2017-01-29 DIAGNOSIS — G8929 Other chronic pain: Secondary | ICD-10-CM | POA: Diagnosis present

## 2017-01-29 DIAGNOSIS — Z79899 Other long term (current) drug therapy: Secondary | ICD-10-CM | POA: Insufficient documentation

## 2017-01-29 DIAGNOSIS — R519 Headache, unspecified: Secondary | ICD-10-CM | POA: Diagnosis present

## 2017-01-29 DIAGNOSIS — Z3A32 32 weeks gestation of pregnancy: Secondary | ICD-10-CM | POA: Insufficient documentation

## 2017-01-29 DIAGNOSIS — R0981 Nasal congestion: Secondary | ICD-10-CM | POA: Insufficient documentation

## 2017-01-29 LAB — URINALYSIS, ROUTINE W REFLEX MICROSCOPIC
BILIRUBIN URINE: NEGATIVE
Glucose, UA: NEGATIVE mg/dL
HGB URINE DIPSTICK: NEGATIVE
Ketones, ur: NEGATIVE mg/dL
NITRITE: NEGATIVE
PROTEIN: NEGATIVE mg/dL
Specific Gravity, Urine: 1.017 (ref 1.005–1.030)
pH: 6 (ref 5.0–8.0)

## 2017-01-29 MED ORDER — DEXAMETHASONE SODIUM PHOSPHATE 10 MG/ML IJ SOLN
10.0000 mg | Freq: Once | INTRAMUSCULAR | Status: AC
  Administered 2017-01-29: 10 mg via INTRAVENOUS
  Filled 2017-01-29: qty 1

## 2017-01-29 MED ORDER — SODIUM CHLORIDE 0.9 % IV SOLN
INTRAVENOUS | Status: DC
  Administered 2017-01-29: 11:00:00 via INTRAVENOUS

## 2017-01-29 MED ORDER — CYCLOBENZAPRINE HCL 5 MG PO TABS
5.0000 mg | ORAL_TABLET | Freq: Once | ORAL | Status: AC
  Administered 2017-01-29: 5 mg via ORAL
  Filled 2017-01-29: qty 1

## 2017-01-29 MED ORDER — DIPHENHYDRAMINE HCL 50 MG/ML IJ SOLN
25.0000 mg | Freq: Once | INTRAMUSCULAR | Status: AC
  Administered 2017-01-29: 25 mg via INTRAVENOUS
  Filled 2017-01-29: qty 1

## 2017-01-29 MED ORDER — PHENOL 1.4 % MT LIQD
1.0000 | OROMUCOSAL | Status: DC | PRN
  Administered 2017-01-29: 1 via OROMUCOSAL
  Filled 2017-01-29: qty 177

## 2017-01-29 MED ORDER — METOCLOPRAMIDE HCL 5 MG/ML IJ SOLN
10.0000 mg | Freq: Once | INTRAMUSCULAR | Status: AC
  Administered 2017-01-29: 10 mg via INTRAVENOUS
  Filled 2017-01-29: qty 2

## 2017-01-29 MED ORDER — CYCLOBENZAPRINE HCL 10 MG PO TABS: 10.0000 mg | ORAL_TABLET | Freq: Two times a day (BID) | ORAL | 0 refills | Status: DC | PRN

## 2017-01-29 NOTE — MAU Note (Signed)
Pt reports a constant headache since Monday. Has not taken any medication because the medications don't work for her anymore. Has come to MAU several times for headaches and they sent her home with Rx and a consult with neurology (on sept 26). [facility] and she reports they told her to stop taking the headache medicine since it stopped working for her. Reports a sore throat and some congestion since Wednesday. No vaginal bleeding, reports no ctx, no LOF. +fetal movement.

## 2017-01-29 NOTE — MAU Provider Note (Signed)
History     CSN: [id number]  Arrival date and time: 01/29/17 1011   First Provider Initiated Contact with Patient 01/29/17 1045      Chief Complaint  Patient presents with  . Headache  . Generalized Body Aches  . Nasal Congestion   HPI  [name] is a 21 yo G2P1001 at 32.[redacted] wks gestation presenting to MAU with complaints of chronic H/A that has been lingering on since Monday (rated 10/10), nausea, congestion and sore throat (rated 9/10) since Wednesday.  She has a low-risk pregnancy and receives [facility].   Past Medical History:  Diagnosis Date  . Seizures (HCC)    when she was a baby    Past Surgical History:  Procedure Laterality Date  . NO PAST SURGERIES      Family History  Problem Relation Age of Onset  . Hypertension Mother   . Diabetes Father   . Hypertension Sister     Social History  Substance Use Topics  . Smoking status: Never Smoker  . Smokeless tobacco: Never Used  . Alcohol use No    Allergies: No Known Allergies  Prescriptions Prior to Admission  Medication Sig Dispense Refill Last Dose  . butalbital-acetaminophen-caffeine (FIORICET, ESGIC) 50-325-40 MG tablet Take 1-2 tablets by mouth every 6 (six) hours as needed for headache. 20 tablet 0   . cyclobenzaprine (FLEXERIL) 10 MG tablet Take 1 tablet (10 mg total) by mouth 2 (two) times daily as needed for muscle spasms. 10 tablet 0   . Elastic Bandages & Supports (COMFORT FIT MATERNITY SUPP LG) MISC Use for support during pregnancy. 1 each 0   . metoCLOPramide (REGLAN) 10 MG tablet Take 1 tablet (10 mg total) by mouth every 6 (six) hours. 10 tablet 0     Review of Systems  Constitutional: Negative.   HENT: Negative.   Eyes: Negative.   Respiratory: Negative.   Cardiovascular: Negative.   Gastrointestinal: Positive for nausea and vomiting (vomited last night).  Endocrine: Negative.   Genitourinary: Negative.   Musculoskeletal: Negative.   Skin: Negative.   Neurological: Positive  for headaches (on top fo head, "feels like sharp needles at times or that I have hit my head on something"). Negative for dizziness, seizures, speech difficulty, light-headedness and numbness.  Hematological: Negative.   Psychiatric/Behavioral: Negative.    Physical Exam   Blood pressure 111/68, pulse 96, temperature 99.2 F (37.3 C), temperature source Oral, resp. rate 18, last menstrual period 06/27/2016.  Physical Exam  Constitutional: She is oriented to person, place, and time. She appears well-developed and well-nourished.  HENT:  Head: Normocephalic.  Eyes: Pupils are equal, round, and reactive to light.  Neck: Normal range of motion.  Cardiovascular: Normal rate, regular rhythm, normal heart sounds and intact distal pulses.   Respiratory: Effort normal and breath sounds normal.  GI: Soft. Bowel sounds are normal.  Genitourinary:  Genitourinary Comments: deferred  Musculoskeletal: Normal range of motion.  Neurological: She is alert and oriented to person, place, and time. She has normal reflexes.  Neg phonophobia; (+) photophobia   NST  FHR: 140 bpm / moderate variability / accels some 10 x 10 and 15 x 15 present / decels absent TOCO: none  MAU Course  Procedures  MDM CCUA IVFs: 0.9% NS 1000 ml bolus, Benadryl 25 mg slow IVP, Decadron 10 mg slow IVP, Reglan 10 mg slow IVP -- no relief Chloraseptic spray  Flexeril 10 mg -- improved H/A  Results for orders placed or performed  during the hospital encounter of 01/29/17 (from the past 24 hour(s))  Urinalysis, Routine w reflex microscopic     Status: Abnormal   Collection Time: 01/29/17 10:19 AM  Result Value Ref Range   Color, Urine YELLOW YELLOW   APPearance HAZY (A) CLEAR   Specific Gravity, Urine 1.017 1.005 - 1.030   pH 6.0 5.0 - 8.0   Glucose, UA NEGATIVE NEGATIVE mg/dL   Hgb urine dipstick NEGATIVE NEGATIVE   Bilirubin Urine NEGATIVE NEGATIVE   Ketones, ur NEGATIVE NEGATIVE mg/dL   Protein, ur NEGATIVE NEGATIVE  mg/dL   Nitrite NEGATIVE NEGATIVE   Leukocytes, UA MODERATE (A) NEGATIVE   RBC / HPF 0-5 0 - 5 RBC/hpf   WBC, UA 0-5 0 - 5 WBC/hpf   Bacteria, UA FEW (A) NONE SEEN   Squamous Epithelial / LPF 6-30 (A) NONE SEEN   Mucus PRESENT     Assessment and Plan  Chronic intractable headache, unspecified headache type - Advised to take Flexeril 10 mg PO BID prn migraines - H/A instructions given - F/U with neurology on 9/26 - Keep scheduled appt on 9/6 with [facility]  Discharge home Patient verbalized an understanding of the plan of care and agrees.   Katherine Moraolitta Jocob Dambach, MSN, CNM 01/29/2017, 10:54 AM

## 2017-01-29 NOTE — Progress Notes (Signed)
Discharge instructions given with pt understanding. Pt left unit via ambulatory 

## 2017-01-29 NOTE — Discharge Instructions (Signed)
General Headache Without Cause A headache is pain or discomfort felt around the head or neck area. The specific cause of a headache may not be found. There are many causes and types of headaches. A few common ones are:  Tension headaches.  Migraine headaches.  Cluster headaches.  Chronic daily headaches.  Follow these instructions at home: Watch your condition for any changes. Take these steps to help with your condition: Managing pain  Take over-the-counter and prescription medicines only as told by your health care provider.  Lie down in a dark, quiet room when you have a headache.  If directed, apply ice to the head and neck area: ? Put ice in a plastic bag. ? Place a towel between your skin and the bag. ? Leave the ice on for 20 minutes, 2-3 times per day.  Use a heating pad or hot shower to apply heat to the head and neck area as told by your health care provider.  Keep lights dim if bright lights bother you or make your headaches worse. Eating and drinking  Eat meals on a regular schedule.  Limit alcohol use.  Decrease the amount of caffeine you drink, or stop drinking caffeine. General instructions  Keep all follow-up visits as told by your health care provider. This is important.  Keep a headache journal to help find out what may trigger your headaches. For example, write down: ? What you eat and drink. ? How much sleep you get. ? Any change to your diet or medicines.  Try massage or other relaxation techniques.  Limit stress.  Sit up straight, and do not tense your muscles.  Do not use tobacco products, including cigarettes, chewing tobacco, or e-cigarettes. If you need help quitting, ask your health care provider.  Exercise regularly as told by your health care provider.  Sleep on a regular schedule. Get 7-9 hours of sleep, or the amount recommended by your health care provider. Contact a health care provider if:  Your symptoms are not helped by  medicine.  You have a headache that is different from the usual headache.  You have nausea or you vomit.  You have a fever. Get help right away if:  Your headache becomes severe.  You have repeated vomiting.  You have a stiff neck.  You have a loss of vision.  You have problems with speech.  You have pain in the eye or ear.  You have muscular weakness or loss of muscle control.  You lose your balance or have trouble walking.  You feel faint or pass out.  You have confusion. This information is not intended to replace advice given to you by your health care provider. Make sure you discuss any questions you have with your health care provider. Document Released: 05/17/2005 Document Revised: 10/23/2015 Document Reviewed: 09/09/2014 Elsevier Interactive Patient Education  2017 Elsevier Inc.  

## 2017-02-23 ENCOUNTER — Encounter: Payer: Self-pay | Admitting: Neurology

## 2017-02-23 ENCOUNTER — Ambulatory Visit (INDEPENDENT_AMBULATORY_CARE_PROVIDER_SITE_OTHER): Payer: Medicaid Other | Admitting: Neurology

## 2017-02-23 VITALS — BP 118/72 | HR 88 | Ht 66.0 in | Wt 235.2 lb

## 2017-02-23 DIAGNOSIS — G44221 Chronic tension-type headache, intractable: Secondary | ICD-10-CM

## 2017-02-23 DIAGNOSIS — R51 Headache: Secondary | ICD-10-CM

## 2017-02-23 DIAGNOSIS — O26899 Other specified pregnancy related conditions, unspecified trimester: Secondary | ICD-10-CM

## 2017-02-23 DIAGNOSIS — H539 Unspecified visual disturbance: Secondary | ICD-10-CM | POA: Diagnosis not present

## 2017-02-23 DIAGNOSIS — O26893 Other specified pregnancy related conditions, third trimester: Secondary | ICD-10-CM

## 2017-02-23 DIAGNOSIS — G8929 Other chronic pain: Secondary | ICD-10-CM

## 2017-02-23 NOTE — Progress Notes (Signed)
[facility]    Provider:  Dr Lucia Gaskins Referring Provider: Alberteen Spindle, NP Primary Care Physician:  Katherine Hess, No Pcp Per  CC:  headache  HPI:  Katherine Hess is a 21 y.o. female here as a referral from Dr. Colon Branch for headache. Past medical history of "convulsions" as a child. Katherine Hess is [redacted] weeks pregnant on 01/13/2017, expected delivery date 03/30/2017, complaining of headaches and the top of her head. No hx of headaches or migraines. Headaches started when she became pregnant. She has worsening headaches since pregnancy. Headaches are daily. The pain is on the top of the head, pressure not pounding or throbbing. No light or sound sensitivity, she sometimes sees "little dots" n her vision with the headaches. Can happen in the night or in the morning and all day long. Can be severe up to 9/10. She has been to the emergency room multiple times (I don't have those records). No tylenol, no OTC medications. Tylenol did not help. Unknown triggers. No confusion, no LOC, no seizure-like events, no double vision, no numbness or weakness. The headache is continuous. Currently a 5/10. She does not want to take any medication until delivery.  Reviewed report of ultrasound in April 2018  IMPRESSION: Single viable intrauterine gestation as above. Small subchorionic hemorrhage.   Review of Systems: Katherine Hess complains of symptoms per HPI as well as the following symptoms: heaadache. Pertinent negatives and positives per HPI. All others negative.   Social History   Social History  . Marital status: Married    Spouse name: Katherine Hess  . Number of children: Katherine Hess  . Years of education: Katherine Hess   Occupational History  . Not on file.   Social History Main Topics  . Smoking status: Never Smoker  . Smokeless tobacco: Never Used  . Alcohol use No  . Drug use: No  . Sexual activity: Yes    Birth control/ protection: None   Other Topics Concern  . Not on file   Social History Narrative  .  No narrative on file    Family History  Problem Relation Age of Onset  . Hypertension Mother   . Diabetes Father   . Hypertension Sister   . Headache Neg Hx     Past Medical History:  Diagnosis Date  . Seizures (HCC)    when she was a baby    Past Surgical History:  Procedure Laterality Date  . NO PAST SURGERIES      Current Outpatient Prescriptions  Medication Sig Dispense Refill  . Prenatal Vit-Fe Fumarate-FA (PRENATAL MULTIVITAMIN) TABS tablet Take 1 tablet by mouth at bedtime.     No current facility-administered medications for this visit.     Allergies as of 02/23/2017  . (No Known Allergies)    Vitals: BP 118/72   Pulse 88   Ht  (1.676 m)   Wt 235 lb 3.2 oz (106.7 kg)   LMP 06/27/2016 (Approximate)   BMI 37.96 kg/m  Last Weight:  Wt Readings from Last 1 Encounters:  02/23/17 235 lb 3.2 oz (106.7 kg)   Last Height:   Ht Readings from Last 1 Encounters:  02/23/17  (1.676 m)    Physical exam: Exam: Gen: NAD, conversant                  CV: RRR, no MRG. No Carotid Bruits. No peripheral edema, warm, nontender Eyes: Conjunctivae clear without exudates or hemorrhage  Neuro: Detailed Neurologic Exam  Speech:    Speech is normal;  fluent and spontaneous with normal comprehension.  Cognition:    The Katherine Hess is oriented to person, place, and time;     recent and remote memory intact;     language fluent;     normal attention, concentration,     fund of knowledge Cranial Nerves:    The pupils are equal, round, and reactive to light. The fundi are normal and spontaneous venous pulsations are present. Visual fields are full to finger confrontation. Extraocular movements are intact. Trigeminal sensation is intact and the muscles of mastication are normal. The face is symmetric. The palate elevates in the midline. Hearing intact. Voice is normal. Shoulder shrug is normal. The tongue has normal motion without fasciculations.   Coordination:     Normal finger to nose and heel to shin. Normal rapid alternating movements.   Gait:    Hyperlordosis and wide-based due to pregnancy  Motor Observation:    No asymmetry, no atrophy, and no involuntary movements noted. Tone:    Normal muscle tone.    Posture:    Posture is normal. normal erect    Strength:    Strength is V/V in the upper and lower limbs.      Sensation: intact to LT     Reflex Exam:  DTR's:    Deep tendon reflexes in the upper and lower extremities are normal bilaterally.   Toes:    The toes are downgoing bilaterally.   Clonus:    Clonus is absent.      Assessment/Plan:  Katherine Hess with continuous tension-type headache started in pregnancy. Some vision changes. Need MRI of the brain and MRV of head to evaluate for cerebral causes of headache and pregnancy including idiopathic intracranial hypertension, reversible cerebral vasoconstriction syndrome, cerebral venous thrombosis, pituitary apoplexy and other serious causes.  Other interventions to try and decreased the use of medication for headache/migraine include:  Cool Compress. Lie down and place a cool compress on your head.  Avoid headache triggers. If certain foods or odors seem to have triggered your migraines in the past, avoid them. A headache diary might help you identify triggers.  Include physical activity in your daily routine. Try a daily walk or other moderate aerobic exercise.  Manage stress. Find healthy ways to cope with the stressors, such as delegating tasks on your to-do list.  Practice relaxation techniques. Try deep breathing, yoga, massage and visualization.  Eat regularly. Eating regularly scheduled meals and maintaining a healthy diet might help prevent headaches. Also, drink plenty of fluids.  Follow a regular sleep schedule. Sleep deprivation might contribute to headaches  Consider biofeedback. With this mind-body technique, you learn to control certain bodily functions - such as muscle  tension, heart rate and blood pressure - to prevent headaches or reduce headache pain.   Headaches during pregnancy are common. However, if you develop a severe headache or a headache that doesn't go away, call your health care provider. Severe headaches can be a sign of a pregnancy complication   Orders Placed This Encounter  Procedures  . MR BRAIN WO CONTRAST  . MR MRV HEAD WO CM  . CBC  . Comprehensive metabolic panel    Cc: Alberteen Spindle, NP  Naomie Dean, MD  [facility] [address] [place], [place] [address]  Phone [phone] Fax [phone]

## 2017-02-23 NOTE — Patient Instructions (Signed)
MRI brain   Tension Headache A tension headache is a feeling of pain, pressure, or aching that is often felt over the front and sides of the head. The pain can be dull, or it can feel tight (constricting). Tension headaches are not normally associated with nausea or vomiting, and they do not get worse with physical activity. Tension headaches can last from 30 minutes to several days. This is the most common type of headache. CAUSES The exact cause of this condition is not known. Tension headaches often begin after stress, anxiety, or depression. Other triggers may include:  Alcohol.  Too much caffeine, or caffeine withdrawal.  Respiratory infections, such as colds, flu, or sinus infections.  Dental problems or teeth clenching.  Fatigue.  Holding your head and neck in the same position for a long period of time, such as while using a computer.  Smoking. SYMPTOMS Symptoms of this condition include:  A feeling of pressure around the head.  Dull, aching head pain.  Pain felt over the front and sides of the head.  Tenderness in the muscles of the head, neck, and shoulders. DIAGNOSIS This condition may be diagnosed based on your symptoms and a physical exam. Tests may be done, such as a CT scan or an MRI of your head. These tests may be done if your symptoms are severe or unusual. TREATMENT This condition may be treated with lifestyle changes and medicines to help relieve symptoms. HOME CARE INSTRUCTIONS Managing Pain  Take over-the-counter and prescription medicines only as told by your health care provider.  Lie down in a dark, quiet room when you have a headache.  If directed, apply ice to the head and neck area: ? Put ice in a plastic bag. ? Place a towel between your skin and the bag. ? Leave the ice on for 20 minutes, 2-3 times per day.  Use a heating pad or a hot shower to apply heat to the head and neck area as told by your health care provider. Eating and  Drinking  Eat meals on a regular schedule.  Limit alcohol use.  Decrease your caffeine intake, or stop using caffeine. General Instructions  Keep all follow-up visits as told by your health care provider. This is important.  Keep a headache journal to help find out what may trigger your headaches. For example, write down: ? What you eat and drink. ? How much sleep you get. ? Any change to your diet or medicines.  Try massage or other relaxation techniques.  Limit stress.  Sit up straight, and avoid tensing your muscles.  Do not use tobacco products, including cigarettes, chewing tobacco, or e-cigarettes. If you need help quitting, ask your health care provider.  Exercise regularly as told by your health care provider.  Get 7-9 hours of sleep, or the amount recommended by your health care provider. SEEK MEDICAL CARE IF:  Your symptoms are not helped by medicine.  You have a headache that is different from what you normally experience.  You have nausea or you vomit.  You have a fever. SEEK IMMEDIATE MEDICAL CARE IF:  Your headache becomes severe.  You have repeated vomiting.  You have a stiff neck.  You have a loss of vision.  You have problems with speech.  You have pain in your eye or ear.  You have muscular weakness or loss of muscle control.  You lose your balance or you have trouble walking.  You feel faint or you pass out.  You  have confusion. This information is not intended to replace advice given to you by your health care provider. Make sure you discuss any questions you have with your health care provider. Document Released: 05/17/2005 Document Revised: 02/05/2015 Document Reviewed: 09/09/2014 Elsevier Interactive Patient Education  2017 [employer].

## 2017-02-23 NOTE — Addendum Note (Signed)
Addended by: Naomie Dean B on: 02/23/2017 02:56 PM   Modules accepted: Level of Service

## 2017-02-24 LAB — COMPREHENSIVE METABOLIC PANEL
A/G RATIO: 1.4 (ref 1.2–2.2)
ALT: 12 IU/L (ref 0–32)
AST: 17 IU/L (ref 0–40)
Albumin: 3.7 g/dL (ref 3.5–5.5)
Alkaline Phosphatase: 158 IU/L — ABNORMAL HIGH (ref 39–117)
BUN/Creatinine Ratio: 19 (ref 9–23)
BUN: 7 mg/dL (ref 6–20)
Bilirubin Total: 0.3 mg/dL (ref 0.0–1.2)
CALCIUM: 8.9 mg/dL (ref 8.7–10.2)
CO2: 20 mmol/L (ref 20–29)
Chloride: 103 mmol/L (ref 96–106)
Creatinine, Ser: 0.37 mg/dL — ABNORMAL LOW (ref 0.57–1.00)
GFR calc Af Amer: 177 mL/min/{1.73_m2} (ref >59)
GFR, EST NON AFRICAN AMERICAN: 153 mL/min/{1.73_m2} (ref >59)
GLUCOSE: 96 mg/dL (ref 65–99)
Globulin, Total: 2.7 g/dL (ref 1.5–4.5)
POTASSIUM: 3.8 mmol/L (ref 3.5–5.2)
Sodium: 139 mmol/L (ref 134–144)
TOTAL PROTEIN: 6.4 g/dL (ref 6.0–8.5)

## 2017-02-24 LAB — CBC
HEMOGLOBIN: 13 g/dL (ref 11.1–15.9)
Hematocrit: 36.3 % (ref 34.0–46.6)
MCH: 32.3 pg (ref 26.6–33.0)
MCHC: 35.8 g/dL — AB (ref 31.5–35.7)
MCV: 90 fL (ref 79–97)
PLATELETS: 213 10*3/uL (ref 150–379)
RBC: 4.03 x10E6/uL (ref 3.77–5.28)
RDW: 12.4 % (ref 12.3–15.4)
WBC: 12.5 10*3/uL — ABNORMAL HIGH (ref 3.4–10.8)

## 2017-02-25 ENCOUNTER — Telehealth: Payer: Self-pay | Admitting: *Deleted

## 2017-02-25 NOTE — Telephone Encounter (Signed)
Spoke with patient and informed her that her labs showed her     WBCs are elevated. Advised her the results will be forwarded  to her OBGYN. Patient stated that she goes to the [facility] to be seen for OB care. She denied any recent illness. She verbalized understanding of call.     Called [facility], will fax results to Linus Orn, NP to review. F [phone].

## 2017-03-03 LAB — OB RESULTS CONSOLE GC/CHLAMYDIA
Chlamydia: NEGATIVE
Gonorrhea: NEGATIVE

## 2017-03-03 LAB — OB RESULTS CONSOLE GBS: GBS: NEGATIVE

## 2017-03-26 ENCOUNTER — Ambulatory Visit
Admission: RE | Admit: 2017-03-26 | Discharge: 2017-03-26 | Disposition: A | Discharge status: Home / Self Care | Payer: Medicaid Other | Source: Ambulatory Visit | Attending: Neurology | Admitting: Neurology

## 2017-03-26 DIAGNOSIS — G8929 Other chronic pain: Secondary | ICD-10-CM

## 2017-03-26 DIAGNOSIS — H539 Unspecified visual disturbance: Secondary | ICD-10-CM

## 2017-03-26 DIAGNOSIS — R51 Headache: Principal | ICD-10-CM

## 2017-03-26 DIAGNOSIS — R519 Headache, unspecified: Secondary | ICD-10-CM

## 2017-03-26 DIAGNOSIS — O26899 Other specified pregnancy related conditions, unspecified trimester: Secondary | ICD-10-CM

## 2017-03-26 DIAGNOSIS — G44221 Chronic tension-type headache, intractable: Secondary | ICD-10-CM

## 2017-03-26 DIAGNOSIS — O26893 Other specified pregnancy related conditions, third trimester: Secondary | ICD-10-CM

## 2017-03-29 ENCOUNTER — Other Ambulatory Visit: Payer: Self-pay | Admitting: Neurology

## 2017-03-29 DIAGNOSIS — G08 Intracranial and intraspinal phlebitis and thrombophlebitis: Secondary | ICD-10-CM

## 2017-03-29 DIAGNOSIS — G932 Benign intracranial hypertension: Secondary | ICD-10-CM

## 2017-03-31 ENCOUNTER — Telehealth (HOSPITAL_COMMUNITY): Payer: Self-pay | Admitting: *Deleted

## 2017-03-31 ENCOUNTER — Encounter (HOSPITAL_COMMUNITY): Payer: Self-pay | Admitting: *Deleted

## 2017-03-31 NOTE — Telephone Encounter (Signed)
[phone] Interpreter number  Preadmission screen

## 2017-04-01 ENCOUNTER — Other Ambulatory Visit (HOSPITAL_COMMUNITY): Payer: Self-pay | Admitting: Nurse Practitioner

## 2017-04-01 DIAGNOSIS — O480 Post-term pregnancy: Secondary | ICD-10-CM

## 2017-04-02 ENCOUNTER — Encounter (HOSPITAL_COMMUNITY): Payer: Self-pay

## 2017-04-02 ENCOUNTER — Inpatient Hospital Stay (HOSPITAL_COMMUNITY)
Admission: AD | Admit: 2017-04-02 | Discharge: 2017-04-02 | Disposition: A | Discharge status: Home / Self Care | Payer: Medicaid Other | Source: Ambulatory Visit | Attending: Obstetrics & Gynecology | Admitting: Obstetrics & Gynecology

## 2017-04-02 DIAGNOSIS — G8929 Other chronic pain: Secondary | ICD-10-CM

## 2017-04-02 DIAGNOSIS — R51 Headache: Secondary | ICD-10-CM

## 2017-04-02 DIAGNOSIS — O479 False labor, unspecified: Secondary | ICD-10-CM

## 2017-04-02 NOTE — Discharge Instructions (Signed)
Contracciones de Braxton Hicks °(Braxton Hicks Contractions) °Durante el embarazo, pueden presentarse contracciones uterinas que no siempre indican que está en trabajo de parto. °¿QUÉ SON LAS CONTRACCIONES DE BRAXTON HICKS? °Las contracciones que se presentan antes del trabajo de parto se conocen como contracciones de Braxton Hicks o falso trabajo de parto. Hacia el final del embarazo (32 a 34 semanas), estas contracciones pueden aparecen con más frecuencia y volverse más intensas. No corresponden al trabajo de parto verdadero porque estas contracciones no producen el agrandamiento (la dilatación) y el afinamiento del cuello del útero. Algunas veces, es difícil distinguirlas del trabajo de parto verdadero porque en algunos casos pueden ser muy intensas, y las personas tienen diferentes niveles de tolerancia al dolor. No debe sentirse avergonzada si concurre al hospital con falso trabajo de parto. En ocasiones, la única forma de saber si el trabajo de parto es verdadero es que el médico determine si hay cambios en el cuello del útero. °Si no hay problemas prenatales u otras complicaciones de salud asociadas con el embarazo, no habrá inconvenientes si la envían a su casa con falso trabajo de parto y espera que comience el verdadero. °CÓMO DIFERENCIAR EL TRABAJO DE PARTO FALSO DEL VERDADERO °Falso trabajo de parto  °· Las contracciones del falso trabajo de parto duran menos y no son tan intensas como las verdaderas. °· Generalmente son irregulares. °· A menudo, se sienten en la parte delantera de la parte baja del abdomen y en la ingle, °· y pueden desaparecer cuando camina o cambia de posición mientras está acostada. °· Las contracciones se vuelven más débiles y su duración es menor a medida que el tiempo transcurre. °· Por lo general, no se hacen progresivamente más intensas, regulares y cercanas entre sí como en el caso del trabajo de parto verdadero. °Verdadero trabajo de parto  °· Las contracciones del verdadero  trabajo de parto duran de 30 a 70 segundos, son muy regulares y suelen volverse más intensas, y aumenta su frecuencia. °· No desaparecen cuando camina. °· La molestia generalmente se siente en la parte superior del útero y se extiende hacia la zona inferior del abdomen y hacia la cintura. °· El médico podrá examinarla para determinar si el trabajo de parto es verdadero. El examen mostrará si el cuello del útero se está dilatando y afinando. °LO QUE DEBE RECORDAR °· Continúe haciendo los ejercicios habituales y siga otras indicaciones que el médico le dé. °· Tome todos los medicamentos como le indicó el médico. °· Concurra a las visitas prenatales regulares. °· Coma y beba con moderación si cree que está en trabajo de parto. °· Si las contracciones de Braxton Hicks le provocan incomodidad: °¨ Cambie de posición: si está acostada o descansando, camine; si está caminando, descanse. °¨ Siéntese y descanse en una bañera con agua tibia. °¨ Beba 2 o 3 vasos de agua. La deshidratación puede provocar contracciones. °¨ Respire lenta y profundamente varias veces por hora. °¿CUÁNDO DEBO BUSCAR ASISTENCIA MÉDICA INMEDIATA? °Solicite atención médica de inmediato si: °· Las contracciones se intensifican, se hacen más regulares y cercanas entre sí. °· Tiene una pérdida de líquido por la vagina. °· Tiene fiebre. °· Elimina mucosidad manchada con sangre. °· Tiene una hemorragia vaginal abundante. °· Tiene dolor abdominal permanente. °· Tiene un dolor en la zona lumbar que nunca tuvo antes. °· Siente que la cabeza del bebé empuja hacia abajo y ejerce presión en la zona pélvica. °· El bebé no se mueve tanto como solía. °Esta información no tiene como fin reemplazar el   consejo del médico. Asegúrese de hacerle al médico cualquier pregunta que tenga. °Document Released: 02/24/2005 Document Revised: 09/08/2015 Document Reviewed: 02/26/2013 °Elsevier Interactive Patient Education © 2017 Elsevier Inc. ° °

## 2017-04-02 NOTE — MAU Note (Signed)
Contractions since 2100 last night. Woke up at 0600 and used restroom, saw some blood when she wiped.Denies LOF. + FM.

## 2017-04-03 ENCOUNTER — Encounter (HOSPITAL_COMMUNITY): Payer: Self-pay | Admitting: *Deleted

## 2017-04-03 ENCOUNTER — Inpatient Hospital Stay (HOSPITAL_COMMUNITY)
Admission: AD | Admit: 2017-04-03 | Discharge: 2017-04-04 | DRG: 807 | Disposition: A | Discharge status: Home / Self Care | Payer: Medicaid Other | Source: Ambulatory Visit | Attending: Obstetrics and Gynecology | Admitting: Obstetrics and Gynecology

## 2017-04-03 DIAGNOSIS — Z3A40 40 weeks gestation of pregnancy: Secondary | ICD-10-CM

## 2017-04-03 DIAGNOSIS — R519 Headache, unspecified: Secondary | ICD-10-CM

## 2017-04-03 DIAGNOSIS — Z3483 Encounter for supervision of other normal pregnancy, third trimester: Secondary | ICD-10-CM | POA: Diagnosis present

## 2017-04-03 DIAGNOSIS — R51 Headache: Secondary | ICD-10-CM

## 2017-04-03 LAB — TYPE AND SCREEN
ABO/RH(D): O POS
Antibody Screen: NEGATIVE

## 2017-04-03 LAB — CBC
HCT: 36.8 % (ref 36.0–46.0)
Hemoglobin: 13.2 g/dL (ref 12.0–15.0)
MCH: 32.8 pg (ref 26.0–34.0)
MCHC: 35.9 g/dL (ref 30.0–36.0)
MCV: 91.5 fL (ref 78.0–100.0)
Platelets: 198 10*3/uL (ref 150–400)
RBC: 4.02 MIL/uL (ref 3.87–5.11)
RDW: 12.5 % (ref 11.5–15.5)
WBC: 12.1 10*3/uL — AB (ref 4.0–10.5)

## 2017-04-03 LAB — RPR: RPR: NONREACTIVE

## 2017-04-03 MED ORDER — LACTATED RINGERS IV SOLN: 500.0000 mL | INTRAVENOUS | Status: DC | PRN

## 2017-04-03 MED ORDER — PRENATAL MULTIVITAMIN CH
1.0000 | ORAL_TABLET | Freq: Every day | ORAL | Status: DC
  Administered 2017-04-04: 1 via ORAL
  Filled 2017-04-03: qty 1

## 2017-04-03 MED ORDER — OXYCODONE-ACETAMINOPHEN 5-325 MG PO TABS: 1.0000 | ORAL_TABLET | ORAL | Status: DC | PRN

## 2017-04-03 MED ORDER — SIMETHICONE 80 MG PO CHEW: 80.0000 mg | CHEWABLE_TABLET | ORAL | Status: DC | PRN

## 2017-04-03 MED ORDER — IBUPROFEN 600 MG PO TABS
600.0000 mg | ORAL_TABLET | Freq: Four times a day (QID) | ORAL | Status: DC
  Administered 2017-04-03 – 2017-04-04 (×6): 600 mg via ORAL
  Filled 2017-04-03 (×6): qty 1

## 2017-04-03 MED ORDER — SENNOSIDES-DOCUSATE SODIUM 8.6-50 MG PO TABS
2.0000 | ORAL_TABLET | ORAL | Status: DC
  Administered 2017-04-04: 2 via ORAL
  Filled 2017-04-03: qty 2

## 2017-04-03 MED ORDER — LACTATED RINGERS IV SOLN
INTRAVENOUS | Status: DC
  Administered 2017-04-03: 02:00:00 via INTRAVENOUS

## 2017-04-03 MED ORDER — OXYCODONE-ACETAMINOPHEN 5-325 MG PO TABS: 2.0000 | ORAL_TABLET | ORAL | Status: DC | PRN

## 2017-04-03 MED ORDER — OXYTOCIN 40 UNITS IN LACTATED RINGERS INFUSION - SIMPLE MED
2.5000 [IU]/h | INTRAVENOUS | Status: DC
  Administered 2017-04-03: 2.5 [IU]/h via INTRAVENOUS
  Filled 2017-04-03: qty 1000

## 2017-04-03 MED ORDER — COCONUT OIL OIL: 1.0000 "application " | TOPICAL_OIL | Status: DC | PRN

## 2017-04-03 MED ORDER — TETANUS-DIPHTH-ACELL PERTUSSIS 5-2.5-18.5 LF-MCG/0.5 IM SUSP: 0.5000 mL | Freq: Once | INTRAMUSCULAR | Status: DC

## 2017-04-03 MED ORDER — FENTANYL CITRATE (PF) 100 MCG/2ML IJ SOLN
100.0000 ug | INTRAMUSCULAR | Status: DC | PRN
  Administered 2017-04-03 (×2): 100 ug via INTRAVENOUS
  Filled 2017-04-03 (×2): qty 2

## 2017-04-03 MED ORDER — OXYTOCIN BOLUS FROM INFUSION
500.0000 mL | Freq: Once | INTRAVENOUS | Status: AC
  Administered 2017-04-03: 500 mL via INTRAVENOUS

## 2017-04-03 MED ORDER — ACETAMINOPHEN 325 MG PO TABS: 650.0000 mg | ORAL_TABLET | ORAL | Status: DC | PRN

## 2017-04-03 MED ORDER — DIPHENHYDRAMINE HCL 25 MG PO CAPS: 25.0000 mg | ORAL_CAPSULE | Freq: Four times a day (QID) | ORAL | Status: DC | PRN

## 2017-04-03 MED ORDER — ACETAMINOPHEN 325 MG PO TABS
650.0000 mg | ORAL_TABLET | ORAL | Status: DC | PRN
  Administered 2017-04-03: 650 mg via ORAL
  Filled 2017-04-03: qty 2

## 2017-04-03 MED ORDER — ZOLPIDEM TARTRATE 5 MG PO TABS: 5.0000 mg | ORAL_TABLET | Freq: Every evening | ORAL | Status: DC | PRN

## 2017-04-03 MED ORDER — WITCH HAZEL-GLYCERIN EX PADS: 1.0000 "application " | MEDICATED_PAD | CUTANEOUS | Status: DC | PRN

## 2017-04-03 MED ORDER — LACTATED RINGERS IV SOLN
INTRAVENOUS | Status: DC
  Administered 2017-04-03: 01:00:00 via INTRAVENOUS

## 2017-04-03 MED ORDER — ONDANSETRON HCL 4 MG PO TABS: 4.0000 mg | ORAL_TABLET | ORAL | Status: DC | PRN

## 2017-04-03 MED ORDER — LIDOCAINE HCL (PF) 1 % IJ SOLN
30.0000 mL | INTRAMUSCULAR | Status: DC | PRN
  Filled 2017-04-03: qty 30

## 2017-04-03 MED ORDER — SOD CITRATE-CITRIC ACID 500-334 MG/5ML PO SOLN: 30.0000 mL | ORAL | Status: DC | PRN

## 2017-04-03 MED ORDER — BENZOCAINE-MENTHOL 20-0.5 % EX AERO: 1.0000 "application " | INHALATION_SPRAY | CUTANEOUS | Status: DC | PRN

## 2017-04-03 MED ORDER — DIBUCAINE 1 % RE OINT: 1.0000 "application " | TOPICAL_OINTMENT | RECTAL | Status: DC | PRN

## 2017-04-03 MED ORDER — ONDANSETRON HCL 4 MG/2ML IJ SOLN: 4.0000 mg | Freq: Four times a day (QID) | INTRAMUSCULAR | Status: DC | PRN

## 2017-04-03 MED ORDER — FLEET ENEMA 7-19 GM/118ML RE ENEM
1.0000 | ENEMA | RECTAL | Status: DC | PRN
Start: 2017-04-03 — End: 2017-04-03

## 2017-04-03 MED ORDER — ONDANSETRON HCL 4 MG/2ML IJ SOLN: 4.0000 mg | INTRAMUSCULAR | Status: DC | PRN

## 2017-04-03 NOTE — MAU Note (Signed)
Pt having contractions which are stronger and closer together. Denies LOF. Reports that she has a headache

## 2017-04-03 NOTE — Progress Notes (Signed)
Patient is a 21 y.o. now G2P2 s/p NSVD at 8768w4d, who was admitted for SOL.  Delivery Note At 5:32 AM a viable female was delivered via Vaginal, Spontaneous (Presentation: Vertex ; OA).  APGAR: 8,9 ; weight: pending  Placenta status: intact, shultz presentation .  Cord: 3VC with the following complications: None Cord pH: N/A  Anesthesia:  None Episiotomy: None Lacerations: Labial Suture Repair: None Est. Blood Loss (mL): 300  Mom to postpartum.  Baby to Couplet care / Skin to Skin.   Head delivered cephalic OA. Nuchal cord present with 1 loop. Tight loop, reduced w/o ligating. Shoulder and body delivered in usual fashion. Infant with spontaneous cry, placed on mother's abdomen, dried and bulb suctioned. Cord clamped x 2 after 1-minute delay, and cut by family member. Cord blood drawn. Placenta delivered spontaneously with gentle cord traction. Fundus firm with massage and Pitocin. Perineum inspected and found to have small left labial laceration, which was found to be hemostatic. No repair needed.  Katherine Hess Katherine Hess 04/03/2017, 5:48 AM 04/03/17, 5:48 AM

## 2017-04-03 NOTE — H&P (Signed)
Obstetric History and Physical  Katherine Hess is a 21 y.o. G2P1001 with IUP at 4255w4d presenting for SOL. Patient states she has been having  Regular contractions, none vaginal bleeding, intact membranes, with active fetal movement.  Has had chronic headaches throughout pregnancy. Was being seen by neurology.  Prenatal Course Source of Care: [facility]  Dating: By 9.5wk US --->  Estimated Date of Delivery: 03/30/17 Pregnancy complications or risks: Patient Active Problem List   Diagnosis Date Noted  . Normal labor 04/03/2017  . Chronic headache 01/29/2017   She plans to breastfeed She desires nexplanon for postpartum contraception.   Prenatal labs and studies: ABO, Rh: O/Positive/-- (05/17 0000) Antibody: Negative (05/17 0000) Rubella: Immune (05/17 0000) RPR: Nonreactive (05/17 0000)  HBsAg: Negative (05/17 0000)  HIV: Non-reactive (05/17 0000)  JYN:WGNFAOZHGBS:Negative (10/04 0000) 1 hr Glucola  101 Genetic screening normal Anatomy US normal  Prenatal Transfer Tool  Maternal Diabetes: No Genetic Screening: Normal Maternal Ultrasounds/Referrals: Normal Fetal Ultrasounds or other Referrals:  None Maternal Substance Abuse:  No Significant Maternal Medications:  None Significant Maternal Lab Results: None  Past Medical History:  Diagnosis Date  . Headache   . Seizures (HCC)    when she was a baby    Past Surgical History:  Procedure Laterality Date  . NO PAST SURGERIES      OB History  Gravida Para Term Preterm AB Living  2 1 1     1   SAB TAB Ectopic Multiple Live Births          1    # Outcome Date GA Lbr Len/2nd Weight Sex Delivery Anes PTL Lv  2 Current           1 Term 2017    F Vag-Spont   LIV      Social History   Social History  . Marital status: Married    Spouse name: N/A  . Number of children: N/A  . Years of education: N/A   Social History Main Topics  . Smoking status: Never Smoker  . Smokeless tobacco: Never Used  . Alcohol use No  . Drug use: No  .  Sexual activity: Yes    Birth control/ protection: None   Other Topics Concern  . None   Social History Narrative  . None    Family History  Problem Relation Age of Onset  . Hypertension Mother   . Diabetes Father   . Hypertension Sister   . Headache Neg Hx     Prescriptions Prior to Admission  Medication Sig Dispense Refill Last Dose  . Prenatal Vit-Fe Fumarate-FA (PRENATAL MULTIVITAMIN) TABS tablet Take 1 tablet by mouth at bedtime.   04/02/2017 at Unknown time    No Known Allergies  Review of Systems: Negative except for what is mentioned in HPI.  Physical Exam: BP 133/86 (BP Location: Right Arm)   Pulse (!) 112   Temp 98.1 F (36.7 C)   Resp 20   Ht 5\' 6"  (1.676 m)   Wt 110.7 kg (244 lb)   LMP 06/27/2016 (Approximate)   BMI 39.38 kg/m  CONSTITUTIONAL: Well-developed, well-nourished female in no acute distress.  HENT:  Normocephalic, atraumatic, External right and left ear normal. Oropharynx is clear and moist EYES: Conjunctivae and EOM are normal. Pupils are equal, round, and reactive to light. No scleral icterus.  NECK: Normal range of motion, supple, no masses SKIN: Skin is warm and dry. No rash noted. Not diaphoretic. No erythema. No pallor. NEUROLOGIC: Alert and oriented  to person, place, and time. Normal reflexes, muscle tone coordination. No cranial nerve deficit noted. PSYCHIATRIC: Normal mood and affect. Normal behavior. Normal judgment and thought content. CARDIOVASCULAR: Normal heart rate noted, regular rhythm RESPIRATORY: Effort and breath sounds normal, no problems with respiration noted ABDOMEN: Soft, nontender, nondistended, gravid. MUSCULOSKELETAL: Normal range of motion. No edema and no tenderness. 2+ distal pulses.  Cervical Exam: Dilation: 6 Effacement (%): 100 Cervical Position: Middle Station: -2 Presentation: Vertex Exam by:: B Mosca RN Presentation: cephalic FHT:  Baseline rate 155 bpm   Variability moderate  Accelerations present    Decelerations none Contractions: Every 3-5 mins   Pertinent Labs/Studies:   No results found for this or any previous visit (from the past 24 hour(s)).  Assessment : Katherine Hess is a 21 y.o. G2P1001 at [redacted]w[redacted]d being admitted for labor.  Plan: Labor: Expectant management. Augmentation as needed.  Analgesia as needed. Patient only wants IV pain medications. FWB: Reassuring fetal heart tracing.  GBS negative Delivery plan: Hopeful for vaginal delivery   Caryl Ada, DO OB Fellow Faculty Practice, [facility] 04/03/2017, 1:43 AM

## 2017-04-03 NOTE — Lactation Note (Signed)
This note was copied from a baby's chart. Lactation Consultation Note  Patient Name: [name] ZOXWR'UToday's Date: 04/03/2017 Reason for consult: Initial assessment   P2, First time breastfeeding.  Baby 8 hours old and per mom has bf x4. Reviewed hand expression with easy flow of colostrum. Mom encouraged to feed baby 8-12 times/24 hours and with feeding cues.  Suggest mother call for LC to view latch for assistance as needed. Discussed basics. Mom made aware of O/P services, breastfeeding support groups, community resources, and our phone # for post-discharge questions.     Maternal Data Has patient been taught Hand Expression?: Yes Does the patient have breastfeeding experience prior to this delivery?: No  Feeding Length of feed: 27 min  LATCH Score                   Interventions Interventions: Breast feeding basics reviewed;Hand express  Lactation Tools Discussed/Used     Consult Status Consult Status: Follow-up Date: 04/04/17 Follow-up type: In-patient    [name], Ruth [facility] 04/03/2017, 2:21 PM

## 2017-04-03 NOTE — MAU Note (Signed)
Contractions since Fri night. Seen MAU Sat am and 4cm and sent home. Ctxs now stronger and closer. Bloody show but no LOF

## 2017-04-04 ENCOUNTER — Encounter (HOSPITAL_COMMUNITY): Payer: Self-pay | Admitting: *Deleted

## 2017-04-04 ENCOUNTER — Telehealth: Payer: Self-pay | Admitting: Neurology

## 2017-04-04 ENCOUNTER — Encounter (HOSPITAL_COMMUNITY): Payer: Self-pay

## 2017-04-04 ENCOUNTER — Ambulatory Visit (HOSPITAL_COMMUNITY): Payer: Medicaid Other

## 2017-04-04 MED ORDER — IBUPROFEN 600 MG PO TABS: 600.0000 mg | ORAL_TABLET | Freq: Four times a day (QID) | ORAL | 0 refills | Status: DC | PRN

## 2017-04-04 NOTE — Telephone Encounter (Signed)
She can wait until after delivery thanks

## 2017-04-04 NOTE — Discharge Summary (Signed)
OB Discharge Summary     Patient Name: Katherine Hess DOB: February 21, 1996 MRN: [id number]  Date of admission: 04/03/2017 Delivering MD:     Date of discharge: 04/04/2017  Admitting diagnosis: 40 wk ctx Intrauterine pregnancy: 2324w4d     Secondary diagnosis:  Active Problems:   Normal labor   SVD (spontaneous vaginal delivery)  Additional problems: none     Discharge diagnosis: Term Pregnancy Delivered                                                                                                Post partum procedures:none  Augmentation: AROM  Complications: None  Hospital course:  Onset of Labor With Vaginal Delivery     21 y.o. yo [id number] at 524w4d was admitted in Latent Labor on 04/03/2017. Patient had an uncomplicated labor course as follows:  Membrane Rupture Time/Date: 3:33 AM ,04/03/2017   Intrapartum Procedures: Episiotomy: None [1]                                         Lacerations:  Labial [10]  Patient had a delivery of a Viable infant. 04/03/2017  Information for the patient's newborn:  Comer LocketGuevara, Girl Elvenia [[id number]][[id number]]  Delivery Method: Vaginal, Spontaneous(Filed from Delivery Summary)    Pateint had an uncomplicated postpartum course.  She is ambulating, tolerating a regular diet, passing flatus, and urinating well. Patient is discharged home in stable condition on 04/04/17.   Physical exam  Vitals:   04/03/17 0850 04/03/17 1250 04/03/17 2016 04/04/17 0546  BP: 121/68 121/74 118/78 111/76  Pulse: 83 86 75 82  Resp: 18 18 17    Temp: 98.9 F (37.2 C) 99 F (37.2 C) 98.3 F (36.8 C) 97.8 F (36.6 C)  TempSrc: Oral Oral Oral Oral  SpO2: 98% 98% 99% 99%  Weight:      Height:       General: alert and cooperative Lochia: appropriate Uterine Fundus: firm Incision: N/A DVT Evaluation: No evidence of DVT seen on physical exam. Labs: Lab Results  Component Value Date   WBC 12.1 (H) 04/03/2017   HGB 13.2 04/03/2017   HCT 36.8 04/03/2017   MCV 91.5  04/03/2017   PLT 198 04/03/2017   CMP Latest Ref Rng & Units 02/23/2017  Glucose 65 - 99 mg/dL 96  BUN 6 - 20 mg/dL 7  Creatinine 1.300.57 - 8.651.00 mg/dL 7.84(O0.37(L)  Sodium 962134 - 952144 mmol/L 139  Potassium 3.5 - 5.2 mmol/L 3.8  Chloride 96 - 106 mmol/L 103  CO2 20 - 29 mmol/L 20  Calcium 8.7 - 10.2 mg/dL 8.9  Total Protein 6.0 - 8.5 g/dL 6.4  Total Bilirubin 0.0 - 1.2 mg/dL 0.3  Alkaline Phos 39 - 117 IU/L 158(H)  AST 0 - 40 IU/L 17  ALT 0 - 32 IU/L 12    Discharge instruction: per After Visit Summary and "Baby and Me Booklet".  After visit meds:  Allergies as of 04/04/2017   No Known Allergies     Medication List  TAKE these medications   ibuprofen 600 MG tablet Commonly known as:  ADVIL,MOTRIN Take 1 tablet (600 mg total) every 6 (six) hours as needed by mouth.   prenatal multivitamin Tabs tablet Take 1 tablet by mouth at bedtime.       Diet: routine diet  Activity: Advance as tolerated. Pelvic rest for 6 weeks.   Outpatient follow up:4 weeks Follow up Appt:No future appointments. Follow up Visit:No Follow-up on file.  Postpartum contraception: Undecided  Newborn Data: Live born female  Birth Weight: 7 lb 8.5 oz (3416 g) APGAR: 8, 9  Newborn Delivery   Birth date/time:  04/03/2017 05:32:00 Delivery type:  Vaginal, Spontaneous     Baby Feeding: Breast Disposition:home with mother   04/04/2017 Cam Hai, CNM 7:55 AM

## 2017-04-04 NOTE — Telephone Encounter (Signed)
Patient is pregnant and is schedule to deliver this Wednesday 04/06/17. [facility] would like to know is this CT urgent or can she wait until she has her baby. Please advise.

## 2017-04-04 NOTE — Discharge Instructions (Signed)
Parto vaginal, cuidados posteriores  (Vaginal Delivery, Care After)  Siga estas instrucciones durante las próximas semanas. Estas indicaciones le proporcionan información acerca de cómo deberá cuidarse después del parto. El médico también podrá darle instrucciones más específicas. El tratamiento ha sido planificado según las prácticas médicas actuales, pero en algunos casos pueden ocurrir problemas. Llame al médico si tiene problemas o preguntas.  QUÉ ESPERAR DESPUÉS DEL PARTO  Después de un parto vaginal, es frecuente tener lo siguiente:  · Hemorragia leve de la vagina.  · Dolor en el abdomen, la vagina y la zona de la piel entre la abertura vaginal y el ano (perineo).  · Calambres pélvicos.  · Fatiga.  INSTRUCCIONES PARA EL CUIDADO EN EL HOGAR  Medicamentos  · Tome los medicamentos de venta libre y los recetados solamente como se lo haya indicado el médico.  · Si le recetaron un antibiótico, tómelo como se lo haya indicado el médico. No interrumpa la administración del antibiótico hasta que lo haya terminado.  Conducir  · No conduzca ni opere maquinaria pesada mientras toma analgésicos recetados.  · No conduzca durante 24 horas si le administraron un sedante.  Estilo de vida  · No beba alcohol. Esto es de suma importancia si está amamantando o toma analgésicos.  · No consuma productos que contengan tabaco, incluidos cigarrillos, tabaco de mascar o cigarrillos electrónicos. Si necesita ayuda para dejar de fumar, consulte al médico.  Comida y bebida  · Beba al menos 8 vasos de 8 onzas (240 cc) de agua todos los días a menos que el médico le indique lo contrario. Si elige amamantar al bebé, quizá deba beber aún más cantidad de agua.  · Ingiera alimentos ricos en fibras todos los días. Estos alimentos pueden ayudarla a prevenir o aliviar el estreñimiento. Los alimentos ricos en fibras incluyen, entre otros:  ? Panes y cereales integrales.  ? Arroz integral.  ? Frijoles.  ? Frutas y verduras  frescas.  Actividad  · Reanude sus actividades normales como se lo haya indicado el médico. Pregúntele al médico qué actividades son seguras para usted.  · Descanse todo lo que pueda. Trate de descansar o tomar una siesta mientras el bebé está durmiendo.  · No levante objetos que pesen más de 10 libras (4,5 kg) hasta que el médico le diga que es seguro hacerlo.  · Hable con el médico sobre cuándo puede volver a tener relaciones sexuales. Esto puede depender de lo siguiente:  ? Riesgo de sufrir infecciones.  ? Velocidad de cicatrización.  ? Comodidad y deseo de tener relaciones sexuales.  Cuidados vaginales  · Si le realizaron una episiotomía o tuvo un desgarro vaginal, contrólese la zona todos los días para detectar signos de infección. Esté atenta a los siguientes signos:  ? Aumento del enrojecimiento, la hinchazón o el dolor.  ? Más líquido o sangre.  ? Calor.  ? Pus o mal olor.  · No use tampones ni se haga duchas vaginales hasta que el médico la autorice.  · Controle la sangre que elimina por la vagina para detectar coágulos. Pueden tener el aspecto de grumos de color rojo oscuro, marrón o negro.  Instrucciones generales  · Mantenga el perineo limpio y seco, como se lo haya indicado el médico.  · Use ropa cómoda y suelta.  · Cuando vaya al baño, siempre higienícese de adelante hacia atrás.  · Pregúntele al médico si puede ducharse o tomar baños de inmersión. Si se le realizó una episiotomía o tuvo un desgarro perineal durante el trabajo del parto o   el parto, es posible que el médico le indique que no tome baños de inmersión durante un determinado tiempo.  · Use un sostén que sujete y ajuste bien sus pechos.  · Si es posible, pídale a alguien que la ayude con las tareas del hogar y a cuidar del bebé durante al menos algunos días después de salir del hospital.  · Concurra a todas las visitas de seguimiento para usted y el bebé, como se lo haya indicado el médico. Esto es importante.  SOLICITE ATENCIÓN MÉDICA  SI:  · Tiene los siguientes síntomas:  ? Secreción vaginal que tiene mal olor.  ? Dificultad para orinar.  ? Dolor al orinar.  ? Aumento o disminución repentinos de la frecuencia con que defeca.  ? Más enrojecimiento, hinchazón o dolor alrededor de la episiotomía o del desgarro vaginal.  ? Más secreción de líquido o sangre de la episiotomía o desgarro vaginal.  ? Pus o mal olor proveniente de la episiotomía o el desgarro vaginal.  ? Fiebre.  ? Erupción cutánea.  ? Poco interés o falta de interés en actividades que solían gustarle.  ? Dudas sobre su cuidado y el del bebé.  · Siente la episiotomía o el desgarro vaginal caliente al tacto.  · La episiotomía o el desgarro vaginal se está abriendo o no parece cicatrizar.  · Siente dolor en las mamas, o están duras o enrojecidas.  · Siente tristeza o preocupación de forma inusual.  · Siente náuseas o vomita.  · Elimina coágulos grandes por la vagina. Si expulsa un coágulo sanguíneo por la vagina, guárdelo para mostrárselo a su médico. No tire la cadena sin que el médico examine el coágulo antes.  · Orina más de lo habitual.  · Se siente mareada o se desmaya.  · No ha amamantado para nada y no ha tenido un período menstrual durante 12 semanas después del parto.  · Dejó de amamantar al bebé y no ha tenido su período menstrual durante 12 semanas después de dejar de amamantar.    SOLICITE ATENCIÓN MÉDICA DE INMEDIATO SI:  · Tiene los siguientes síntomas:  ? Dolor que no desaparece o no mejora con el medicamento.  ? Dolor en el pecho.  ? Dificultad para respirar.  ? Visión borrosa o manchas en la vista.  ? Pensamientos de autolesionarse o lesionar al bebé.  · Comienza a sentir dolor en el abdomen o en una de las piernas.  · Dolor de cabeza intenso.  · Se desmaya.  · Tiene una hemorragia tan intensa de la vagina que empapa dos toallitas sanitarias en una hora.    Esta información no tiene como fin reemplazar el consejo del médico. Asegúrese de hacerle al médico cualquier  pregunta que tenga.  Document Released: 05/17/2005 Document Revised: 09/08/2015 Document Reviewed: 06/01/2015  Elsevier Interactive Patient Education © 2017 Elsevier Inc[employer]

## 2017-04-04 NOTE — Telephone Encounter (Signed)
Noted, thank you I did let [facility] know.

## 2017-04-04 NOTE — Progress Notes (Signed)
Labor Progress Note Katherine Hess is a 21 y.o. G2P2002 at 5563w4d admitted SOL. Delivered by SVD yesterday at 0530.  S: Pt is resting comfortably in bed. Denies any headaches, blurry vision, or RUQ. States that vaginal bleeding is getting less, has not passed any clots.  O:  BP 111/76 (BP Location: Left Arm)   Pulse 82   Temp 97.8 F (36.6 C) (Oral)   Resp 17   Ht 5\' 6"  (1.676 m)   Wt 110.7 kg (244 lb)   LMP 06/27/2016 (Approximate)   SpO2 99%   Breastfeeding? Unknown   BMI 39.38 kg/m    CVE: Dilation: 10 Dilation Complete Date: 04/03/17 Dilation Complete Time: 0528 Effacement (%): 100 Cervical Position: Middle Station: +3 Presentation: Vertex Exam by:: Katherine SalaamLynnsey Johnson, RN    A&P: 21 y.o. [id number] 7363w4d admitted for SOL. PPD#1. Doing well overall. Plans to breast and use Nexplanon for contraception. Labs nml, vitals stable. Would like to go home if baby is cleared.  #Pain: Mild abd cramps relieved by motrin #GBS negative  Katherine Hess, Medical Student 7:49 AM .

## 2017-04-04 NOTE — Lactation Note (Signed)
This note was copied from a baby's chart. Lactation Consultation Note  Patient Name: Girl Montez Moritariana Weathington ZOXWR'UToday's Date: 04/04/2017 Reason for consult: Follow-up assessment   Baby 27 hours old.  First time breastfeeding.  Mother denies problems or concerns. Baby sucking on pacifier in crib.  Provided education regarding pacifiers. Mother states baby had recently Bf on one breast.  Recommend mother breastfeed on both breasts per feeding if baby desires. Mother hand expressed good flow of colostrum before latching. LC unwrapped baby for feeding.  Mother latched baby on L breast. Sucks and swallows observed with breast compression. Discussed feeding on demand.  Mom encouraged to feed baby 8-12 times/24 hours and with feeding cues.  Reviewed applying ebm or coconut oil for soreness. Reviewed engorgement care and monitoring voids/stools. Provided mother with manual pump.    Maternal Data    Feeding Feeding Type: Breast Fed Length of feed: 15 min  LATCH Score Latch: Grasps breast easily, tongue down, lips flanged, rhythmical sucking.  Audible Swallowing: A few with stimulation  Type of Nipple: Everted at rest and after stimulation  Comfort (Breast/Nipple): Soft / non-tender  Hold (Positioning): No assistance needed to correctly position infant at breast.  LATCH Score: 9  Interventions Interventions: Hand express;Hand pump  Lactation Tools Discussed/Used     Consult Status Consult Status: Complete    Hardie PulleyBerkelhammer, Ruth Boschen 04/04/2017, 9:01 AM

## 2017-04-06 ENCOUNTER — Telehealth: Payer: Self-pay | Admitting: Neurology

## 2017-04-06 ENCOUNTER — Inpatient Hospital Stay (HOSPITAL_COMMUNITY)
Admission: RE | Admit: 2017-04-06 | Discharge: 2017-04-06 | Disposition: A | Discharge status: Home / Self Care | Payer: Medicaid Other | Source: Ambulatory Visit | Attending: Family Medicine | Admitting: Family Medicine

## 2017-04-06 NOTE — Telephone Encounter (Signed)
Alvino ChapelEllen with [facility] just emailed me and informed me that medicaid did not approve the CT. The phone number for the peer to peer is [phone] and the case number is [id number]. [facility]

## 2017-04-07 ENCOUNTER — Inpatient Hospital Stay: Admit: 2017-04-07 | Payer: Medicaid Other

## 2017-04-07 NOTE — Telephone Encounter (Signed)
Noted, I left a voicemail on [place]ro[place]en phone at [facility].

## 2017-04-07 NOTE — Telephone Encounter (Signed)
Have her reschedule I have not been able to call yet

## 2017-04-12 NOTE — Telephone Encounter (Signed)
Noted, thank you

## 2017-04-12 NOTE — Telephone Encounter (Signed)
Bethany, Would u call patient this morning and see if she is breast feeding? If not I can add contrast to this study. Is she still having a headache?  Irving Burtonmily, will le tyou know what we decide to do

## 2017-04-12 NOTE — Telephone Encounter (Signed)
Noted, she is schedule to have a follow up on 04/20/17

## 2017-04-12 NOTE — Telephone Encounter (Signed)
She rescheduled to have her CT for [facility].

## 2017-04-12 NOTE — Telephone Encounter (Signed)
Called patient, she says she is breastfeeding and she does still have a headache. I told her I would let Dr. Lucia GaskinsAhern know.

## 2017-04-12 NOTE — Telephone Encounter (Signed)
Irving Burtonmily, This case does not allow additional verbal clinical information. In order to get it approved, Please fax copy of MRV with addendum to : [phone]. I also need to change the study to include contrast. Should we just withdraw and have me order the new study?  Dr. Epimenio FootSater, would you ensure there is an addendum? I can't find it. Trying to get patient an MRV. Thank you

## 2017-04-12 NOTE — Telephone Encounter (Signed)
I dictated an addendum.

## 2017-04-13 ENCOUNTER — Other Ambulatory Visit: Payer: Medicaid Other

## 2017-04-20 ENCOUNTER — Ambulatory Visit: Payer: Medicaid Other | Admitting: Neurology

## 2017-04-20 ENCOUNTER — Encounter: Payer: Self-pay | Admitting: Neurology

## 2017-04-20 VITALS — BP 113/78 | HR 70 | Ht 66.0 in | Wt 217.4 lb

## 2017-04-20 DIAGNOSIS — G08 Intracranial and intraspinal phlebitis and thrombophlebitis: Secondary | ICD-10-CM

## 2017-04-20 DIAGNOSIS — I8290 Acute embolism and thrombosis of unspecified vein: Secondary | ICD-10-CM | POA: Diagnosis not present

## 2017-04-20 MED ORDER — GABAPENTIN 300 MG PO CAPS: 300.0000 mg | ORAL_CAPSULE | Freq: Three times a day (TID) | ORAL | 11 refills | Status: DC

## 2017-04-20 NOTE — Progress Notes (Signed)
[facility]    Provider:  Dr Lucia GaskinsAhern Referring Provider: No ref. provider found Primary Care Physician:  Patient, No Pcp Per   Interval history: Headaches are worsening. The pain is on the top of the head and no on the sides. It is pulsating. No light or sound sensitivity, severe pain, this past Saturday the pain was so bad the left ear was hurting, she has nausea, vomiting. No blurry vision or weakness. She is breastfeeding. Continuous pain, worse in the evening, ear is hurting more.   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 female here as a referral from Dr. Colon Brancharson for headache. Past medical history of "convulsions" as a child. Patient is [redacted] weeks pregnant on 01/13/2017, expected delivery date 03/30/2017, complaining of headaches and the top of her head. No hx of headaches or migraines. Headaches started when she became pregnant. She has worsening headaches since pregnancy. Headaches are daily. The pain is on the top of the head, pressure not pounding or throbbing. No light or sound sensitivity, she sometimes sees "little dots" n her vision with the headaches. Can happen in the night or in the morning and all day long. Can be severe up to 9/10. She has been to the emergency room multiple times (I don't have those records). No tylenol, no OTC medications. Tylenol did not help. Unknown triggers. No confusion, no LOC, no seizure-like events, no double vision, no numbness or weakness. The headache is continuous. Currently a 5/10. She does not want to take any medication until delivery.  Reviewed report of ultrasound in April 2018  IMPRESSION: Single viable intrauterine gestation as above. Small subchorionic hemorrhage.     CC: CC:  headache  HPI:  Katherine Hess is a 21 y.o. female here as a referral from Dr. Colon Brancharson for headache. Past medical history  of "convulsions" as a child. Patient is [redacted] weeks pregnant on 01/13/2017, expected delivery date 03/30/2017, complaining of headaches and the top of her head. No hx of headaches or migraines. Headaches started when she became pregnant. She has worsening headaches since pregnancy. Headaches are daily. The pain is on the top of the head, pressure not pounding or throbbing. No light or sound sensitivity, she sometimes sees "little dots" n her vision with the headaches. Can happen in the night or in the morning and all day long. Can be severe up to 9/10. She has been to the emergency room multiple times (I don't have those records). No tylenol, no OTC medications. Tylenol did not help. Unknown triggers. No confusion, no LOC, no seizure-like events, no double vision, no numbness or weakness. The headache is continuous. Currently a 5/10. She does not want to take any medication until delivery.  Reviewed report of ultrasound in April 2018  IMPRESSION: Single viable intrauterine gestation as above. Small subchorionic hemorrhage.     Social History   Socioeconomic History  . Marital status: Married    Spouse name: Not on file  . Number of children: Not on file  . Years of education: Not on file  . Highest education level: Not on file  Social Needs  . Financial resource strain: Not on file  . Food insecurity - worry: Not on file  . Food insecurity - inability: Not on file  . Transportation needs - medical: Not on file  . Transportation needs - non-medical: Not on file  Occupational History  . Not on file  Tobacco Use  . Smoking status: Never Smoker  . Smokeless tobacco: Never Used  Substance and Sexual Activity  . Alcohol use: No  . Drug use: No  . Sexual activity: Yes    Birth control/protection: None  Other Topics Concern  . Not on file  Social History Narrative  . Not on file    Family History  Problem Relation Age of Onset  . Hypertension Mother   . Diabetes Father   .  Hypertension Sister   . Headache Neg Hx     Past Medical History:  Diagnosis Date  . Headache   . Seizures (HCC)    when she was a baby    Past Surgical History:  Procedure Laterality Date  . NO PAST SURGERIES      Current Outpatient Medications  Medication Sig Dispense Refill  . ibuprofen (ADVIL,MOTRIN) 600 MG tablet Take 1 tablet (600 mg total) every 6 (six) hours as needed by mouth. 30 tablet 0  . Prenatal Vit-Fe Fumarate-FA (PRENATAL MULTIVITAMIN) TABS tablet Take 1 tablet by mouth at bedtime.     No current facility-administered medications for this visit.     Allergies as of 04/20/2017  . (No Known Allergies)    Vitals: BP 113/78   Pulse 70   Ht 5\' 6"  (1.676 m)   Wt 217 lb 6.4 oz (98.6 kg)   LMP 06/27/2016 (Approximate)   BMI 35.09 kg/m  Last Weight:  Wt Readings from Last 1 Encounters:  04/20/17 217 lb 6.4 oz (98.6 kg)   Last Height:   Ht Readings from Last 1 Encounters:  04/20/17 5\' 6"  (1.676 m)    Physical exam: Exam: Gen: NAD, conversant, well nourised, obese, well groomed                     CV: RRR, no MRG. No Carotid Bruits. No peripheral edema, warm, nontender Eyes: Conjunctivae clear without exudates or hemorrhage  Neuro: Detailed Neurologic Exam  Speech:    Speech is normal; fluent and spontaneous with normal comprehension.  Cognition:    The patient is oriented to person, place, and time;     recent and remote memory intact;     language fluent;     normal attention, concentration,     fund of knowledge Cranial Nerves:    The pupils are equal, round, and reactive to light. Right ONH edema? Visual fields are full to finger confrontation. Extraocular movements are intact. Trigeminal sensation is intact and the muscles of mastication are normal. The face is symmetric. The palate elevates in the midline. Hearing intact. Voice is normal. Shoulder shrug is normal. The tongue has normal motion without fasciculations.   Coordination:     Normal finger to nose and heel to shin. Normal rapid alternating movements.   Gait:    Heel-toe and tandem gait are normal.   Motor Observation:    No asymmetry, no atrophy, and no involuntary movements noted. Tone:    Normal muscle tone.    Posture:    Posture is normal. normal erect    Strength:    Strength is V/V in the upper and lower limbs.      Sensation: intact to LT     Reflex Exam:  DTR's:    Deep tendon reflexes in the upper and lower extremities are normal bilaterally.   Toes:    The toes are downgoing bilaterally.   Clonus:    Clonus  is absent.      Assessment/Plan:  21 year old with intractable headaches, possible right optic nerve head edema, MRV showed possible venous sinus thrombosis and recommended CTV will order  Needs a CT Venogram to evaluate for venous thrombosis given right ONH edema and MRV showing possible venous stricture. Needs to dispose of breast milk for 24 hours after contrast administration.   Start gabapentin tid  To prevent or relieve headaches, try the following: Cool Compress. Lie down and place a cool compress on your head.  Avoid headache triggers. If certain foods or odors seem to have triggered your migraines in the past, avoid them. A headache diary might help you identify triggers.  Include physical activity in your daily routine. Try a daily walk or other moderate aerobic exercise.  Manage stress. Find healthy ways to cope with the stressors, such as delegating tasks on your to-do list.  Practice relaxation techniques. Try deep breathing, yoga, massage and visualization.  Eat regularly. Eating regularly scheduled meals and maintaining a healthy diet might help prevent headaches. Also, drink plenty of fluids.  Follow a regular sleep schedule. Sleep deprivation might contribute to headaches Consider biofeedback. With this mind-body technique, you learn to control certain bodily functions - such as muscle tension, heart rate and blood  pressure - to prevent headaches or reduce headache pain.    Proceed to emergency room if you experience new or worsening symptoms or symptoms do not resolve, if you have new neurologic symptoms or if headache is severe, or for any concerning symptom.   Provided education and documentation from American headache Society toolbox including articles on: chronic migraine medication overuse headache, chronic migraines, prevention of migraines, behavioral and other nonpharmacologic treatments for headache.   Naomie Dean, MD  [facility] [address] [place], [place] [address]  Phone [phone] Fax [phone]  A total of 25 minutes was spent face-to-face with this patient. Over half this time was spent on counseling patient on the venous thrombosis diagnosis and different diagnostic and therapeutic options available.

## 2017-04-20 NOTE — Patient Instructions (Signed)
Will order imaging of your brain Gabapentin 3x a day Provided Lactmed information If contrast used needs to dispose of breast milk for 24 hours  Gabapentin capsules or tablets What is this medicine? GABAPENTIN (GA ba pen tin) is used to control partial seizures in adults with epilepsy. It is also used to treat certain types of nerve pain. This medicine may be used for other purposes; ask your health care provider or pharmacist if you have questions. COMMON BRAND NAME(S): Active-PAC with Gabapentin, Gabarone, Neurontin What should I tell my health care provider before I take this medicine? They need to know if you have any of these conditions: -kidney disease -suicidal thoughts, plans, or attempt; a previous suicide attempt by you or a family member -an unusual or allergic reaction to gabapentin, other medicines, foods, dyes, or preservatives -pregnant or trying to get pregnant -breast-feeding How should I use this medicine? Take this medicine by mouth with a glass of water. Follow the directions on the prescription label. You can take it with or without food. If it upsets your stomach, take it with food.Take your medicine at regular intervals. Do not take it more often than directed. Do not stop taking except on your doctor's advice. If you are directed to break the 600 or 800 mg tablets in half as part of your dose, the extra half tablet should be used for the next dose. If you have not used the extra half tablet within 28 days, it should be thrown away. A special MedGuide will be given to you by the pharmacist with each prescription and refill. Be sure to read this information carefully each time. Talk to your pediatrician regarding the use of this medicine in children. Special care may be needed. Overdosage: If you think you have taken too much of this medicine contact a poison control center or emergency room at once. NOTE: This medicine is only for you. Do not share this medicine with  others. What if I miss a dose? If you miss a dose, take it as soon as you can. If it is almost time for your next dose, take only that dose. Do not take double or extra doses. What may interact with this medicine? Do not take this medicine with any of the following medications: -other gabapentin products This medicine may also interact with the following medications: -alcohol -antacids -antihistamines for allergy, cough and cold -certain medicines for anxiety or sleep -certain medicines for depression or psychotic disturbances -homatropine; hydrocodone -naproxen -narcotic medicines (opiates) for pain -phenothiazines like chlorpromazine, mesoridazine, prochlorperazine, thioridazine This list may not describe all possible interactions. Give your health care provider a list of all the medicines, herbs, non-prescription drugs, or dietary supplements you use. Also tell them if you smoke, drink alcohol, or use illegal drugs. Some items may interact with your medicine. What should I watch for while using this medicine? Visit your doctor or health care professional for regular checks on your progress. You may want to keep a record at home of how you feel your condition is responding to treatment. You may want to share this information with your doctor or health care professional at each visit. You should contact your doctor or health care professional if your seizures get worse or if you have any new types of seizures. Do not stop taking this medicine or any of your seizure medicines unless instructed by your doctor or health care professional. Stopping your medicine suddenly can increase your seizures or their severity. Wear a medical  identification bracelet or chain if you are taking this medicine for seizures, and carry a card that lists all your medications. You may get drowsy, dizzy, or have blurred vision. Do not drive, use machinery, or do anything that needs mental alertness until you know how  this medicine affects you. To reduce dizzy or fainting spells, do not sit or stand up quickly, especially if you are an older patient. Alcohol can increase drowsiness and dizziness. Avoid alcoholic drinks. Your mouth may get dry. Chewing sugarless gum or sucking hard candy, and drinking plenty of water will help. The use of this medicine may increase the chance of suicidal thoughts or actions. Pay special attention to how you are responding while on this medicine. Any worsening of mood, or thoughts of suicide or dying should be reported to your health care professional right away. Women who become pregnant while using this medicine may enroll in the [place] American Antiepileptic Drug Pregnancy Registry by calling [phone]. This registry collects information about the safety of antiepileptic drug use during pregnancy. What side effects may I notice from receiving this medicine? Side effects that you should report to your doctor or health care professional as soon as possible: -allergic reactions like skin rash, itching or hives, swelling of the face, lips, or tongue -worsening of mood, thoughts or actions of suicide or dying Side effects that usually do not require medical attention (report to your doctor or health care professional if they continue or are bothersome): -constipation -difficulty walking or controlling muscle movements -dizziness -nausea -slurred speech -tiredness -tremors -weight gain This list may not describe all possible side effects. Call your doctor for medical advice about side effects. You may report side effects to FDA at 1-800-FDA-1088. Where should I keep my medicine? Keep out of reach of children. This medicine may cause accidental overdose and death if it taken by other adults, children, or pets. Mix any unused medicine with a substance like cat litter or coffee grounds. Then throw the medicine away in a sealed container like a sealed bag or a coffee can with a  lid. Do not use the medicine after the expiration date. Store at room temperature between 15 and 30 degrees C (59 and 86 degrees F). NOTE: This sheet is a summary. It may not cover all possible information. If you have questions about this medicine, talk to your doctor, pharmacist, or health care provider.  2018 Elsevier/Gold Standard (2013-07-13 15:26:50)

## 2017-06-14 ENCOUNTER — Ambulatory Visit: Payer: Medicaid Other | Admitting: Neurology

## 2017-06-14 ENCOUNTER — Telehealth: Payer: Self-pay | Admitting: *Deleted

## 2017-06-14 NOTE — Telephone Encounter (Signed)
Patient no show f/u appt on 06/14/2017 @ 3:30.

## 2017-06-21 ENCOUNTER — Encounter: Payer: Self-pay | Admitting: Neurology

## 2017-10-10 ENCOUNTER — Emergency Department (HOSPITAL_COMMUNITY): Payer: Medicaid Other

## 2017-10-10 ENCOUNTER — Encounter (HOSPITAL_COMMUNITY): Payer: Self-pay | Admitting: Emergency Medicine

## 2017-10-10 ENCOUNTER — Emergency Department (HOSPITAL_COMMUNITY)
Admission: EM | Admit: 2017-10-10 | Discharge: 2017-10-10 | Disposition: A | Discharge status: Home / Self Care | Payer: Medicaid Other | Attending: Emergency Medicine | Admitting: Emergency Medicine

## 2017-10-10 DIAGNOSIS — Z79899 Other long term (current) drug therapy: Secondary | ICD-10-CM | POA: Insufficient documentation

## 2017-10-10 DIAGNOSIS — Y939 Activity, unspecified: Secondary | ICD-10-CM | POA: Diagnosis not present

## 2017-10-10 DIAGNOSIS — S0992XA Unspecified injury of nose, initial encounter: Secondary | ICD-10-CM | POA: Diagnosis present

## 2017-10-10 DIAGNOSIS — S022XXA Fracture of nasal bones, initial encounter for closed fracture: Secondary | ICD-10-CM | POA: Diagnosis not present

## 2017-10-10 DIAGNOSIS — T07XXXA Unspecified multiple injuries, initial encounter: Secondary | ICD-10-CM | POA: Diagnosis not present

## 2017-10-10 DIAGNOSIS — Y929 Unspecified place or not applicable: Secondary | ICD-10-CM | POA: Diagnosis not present

## 2017-10-10 DIAGNOSIS — Y999 Unspecified external cause status: Secondary | ICD-10-CM | POA: Diagnosis not present

## 2017-10-10 LAB — I-STAT CHEM 8, ED
BUN: 8 mg/dL (ref 6–20)
CALCIUM ION: 1.13 mmol/L — AB (ref 1.15–1.40)
Chloride: 104 mmol/L (ref 101–111)
Creatinine, Ser: 0.5 mg/dL (ref 0.44–1.00)
GLUCOSE: 78 mg/dL (ref 65–99)
HCT: 40 % (ref 36.0–46.0)
Hemoglobin: 13.6 g/dL (ref 12.0–15.0)
Potassium: 3.8 mmol/L (ref 3.5–5.1)
Sodium: 142 mmol/L (ref 135–145)
TCO2: 26 mmol/L (ref 22–32)

## 2017-10-10 MED ORDER — IOPAMIDOL (ISOVUE-370) INJECTION 76%
50.0000 mL | Freq: Once | INTRAVENOUS | Status: AC | PRN
  Administered 2017-10-10: 50 mL via INTRAVENOUS

## 2017-10-10 MED ORDER — IOPAMIDOL (ISOVUE-370) INJECTION 76%
INTRAVENOUS | Status: AC
  Filled 2017-10-10: qty 50
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
## 2017-10-10 NOTE — ED Notes (Signed)
Patient transported to CT 
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
## 2017-12-19 ENCOUNTER — Encounter (HOSPITAL_COMMUNITY): Payer: Self-pay | Admitting: *Deleted

## 2017-12-19 ENCOUNTER — Emergency Department (HOSPITAL_COMMUNITY)
Admission: EM | Admit: 2017-12-19 | Discharge: 2017-12-19 | Disposition: A | Discharge status: Home / Self Care | Payer: Medicaid Other | Attending: Emergency Medicine | Admitting: Emergency Medicine

## 2017-12-19 DIAGNOSIS — Z79899 Other long term (current) drug therapy: Secondary | ICD-10-CM | POA: Diagnosis not present

## 2017-12-19 DIAGNOSIS — R070 Pain in throat: Secondary | ICD-10-CM | POA: Diagnosis present

## 2017-12-19 DIAGNOSIS — J020 Streptococcal pharyngitis: Secondary | ICD-10-CM | POA: Insufficient documentation

## 2017-12-19 LAB — GROUP A STREP BY PCR: Group A Strep by PCR: DETECTED — AB

## 2017-12-19 MED ORDER — AMOXICILLIN 500 MG PO CAPS: 500.0000 mg | ORAL_CAPSULE | Freq: Three times a day (TID) | ORAL | 0 refills | Status: AC

## 2017-12-19 NOTE — ED Notes (Signed)
ED Provider at bedside. 

## 2017-12-19 NOTE — ED Triage Notes (Signed)
Pt in c/o sore throat since Saturday, denies cough or nasal congestion, reports fever on Saturday

## 2017-12-19 NOTE — ED Provider Notes (Signed)
[facility] EMERGENCY DEPARTMENT Provider Note   CSN: [id number] Arrival date & time: 12/19/17  1144     History   Chief Complaint Chief Complaint  Patient presents with  . Sore Throat    HPI Katherine Hess is a 22 y.o. female.  22 year old female presents with complaint of sore throat x2 days.  Denies cough, fever, congestion, sick contacts.  Pain is worse with swallowing.  No other complaints or concerns.     Past Medical History:  Diagnosis Date  . Headache   . Seizures (HCC)    when she was a baby    Patient Active Problem List   Diagnosis Date Noted  . Normal labor 04/03/2017  . SVD (spontaneous vaginal delivery) 04/03/2017  . Chronic headache 01/29/2017    Past Surgical History:  Procedure Laterality Date  . NO PAST SURGERIES       OB History    Gravida  2   Para  2   Term  2   Preterm      AB      Living  2     SAB      TAB      Ectopic      Multiple  0   Live Births  2            Home Medications    Prior to Admission medications   Medication Sig Start Date End Date Taking? Authorizing Provider  amoxicillin (AMOXIL) 500 MG capsule Take 1 capsule (500 mg total) by mouth 3 (three) times daily for 10 days. 12/19/17 12/29/17  Jeannie Fend, PA-C  gabapentin (NEURONTIN) 300 MG capsule Take 1 capsule (300 mg total) by mouth 3 (three) times daily. 04/20/17   Anson Fret, MD  ibuprofen (ADVIL,MOTRIN) 600 MG tablet Take 1 tablet (600 mg total) every 6 (six) hours as needed by mouth. 04/04/17   Arabella Merles, CNM  Prenatal Vit-Fe Fumarate-FA (PRENATAL MULTIVITAMIN) TABS tablet Take 1 tablet by mouth at bedtime.    [provider]    Family History Family History  Problem Relation Age of Onset  . Hypertension Mother   . Diabetes Father   . Hypertension Sister   . Headache Neg Hx     Social History Social History   Tobacco Use  . Smoking status: Never Smoker  . Smokeless tobacco: Never Used    Substance Use Topics  . Alcohol use: No  . Drug use: No     Allergies   Patient has no known allergies.   Review of Systems Review of Systems  Constitutional: Negative for chills and fever.  HENT: Positive for sore throat. Negative for congestion, dental problem, ear discharge, ear pain, sinus pressure, sinus pain, trouble swallowing and voice change.   Eyes: Negative for discharge and redness.  Respiratory: Negative for cough.   Gastrointestinal: Negative for vomiting.  Musculoskeletal: Negative for neck pain and neck stiffness.  Skin: Negative for rash and wound.  Allergic/Immunologic: Negative for immunocompromised state.  Neurological: Negative for headaches.  Hematological: Negative for adenopathy.  Psychiatric/Behavioral: Negative for confusion.  All other systems reviewed and are negative.    Physical Exam Updated Vital Signs BP 115/71 (BP Location: Right Arm)   Pulse 80   Temp 98.8 F (37.1 C) (Oral)   Resp 16   SpO2 100%   Physical Exam  Constitutional: She is oriented to person, place, and time. She appears well-developed and well-nourished. No distress.  HENT:  Head:  Normocephalic and atraumatic.  Right Ear: Hearing, tympanic membrane and ear canal normal.  Left Ear: Hearing, tympanic membrane and ear canal normal.  Mouth/Throat: Uvula is midline, oropharynx is clear and moist and mucous membranes are normal. No tonsillar abscesses. Tonsils are 2+ on the right. Tonsils are 2+ on the left. Tonsillar exudate.  Neck: Neck supple.  Cardiovascular: Normal rate and normal heart sounds.  No murmur heard. Pulmonary/Chest: Effort normal and breath sounds normal. No respiratory distress.  Lymphadenopathy:    She has no cervical adenopathy.  Neurological: She is alert and oriented to person, place, and time.  Skin: Skin is warm and dry. No rash noted. She is not diaphoretic.  Psychiatric: She has a normal mood and affect. Her behavior is normal.  Nursing note and  vitals reviewed.    ED Treatments / Results  Labs (all labs ordered are listed, but only abnormal results are displayed) Labs Reviewed  GROUP A STREP BY PCR - Abnormal; Notable for the following components:      Result Value   Group A Strep by PCR DETECTED (*)    All other components within normal limits    EKG None  Radiology No results found.  Procedures Procedures (including critical care time)  Medications Ordered in ED Medications - No data to display   Initial Impression / Assessment and Plan / ED Course  I have reviewed the triage vital signs and the nursing notes.  Pertinent labs & imaging results that were available during my care of the patient were reviewed by me and considered in my medical decision making (see chart for details).  Clinical Course as of Dec 19 1256  Mon Dec 19, 2017  [id number] 22 year old female with complaint of sore throat x2 days.  On exam her tonsils are enlarged, erythematous, light exudate.  Uvula is midline, no evidence of peritonsillar abscess at this time.  Strep test is positive.  Patient will be treated with amoxicillin, recommend Motrin and Tylenol.  Follow-up with PCP, referral given if needed.  Return to ER for worsening or concerning symptoms.   [LM]    Clinical Course User Index [LM] Jeannie FendMurphy, Libi Corso A, PA-C    Final Clinical Impressions(s) / ED Diagnoses   Final diagnoses:  Strep throat    ED Discharge Orders        Ordered    amoxicillin (AMOXIL) 500 MG capsule  3 times daily     12/19/17 1256       Alden HippMurphy, Mireyah Chervenak A, PA-C 12/19/17 1258    Gwyneth SproutPlunkett, Whitney, MD 12/19/17 2026

## 2017-12-19 NOTE — ED Notes (Signed)
Pt verbalized understanding of discharge instructions and denies any further questions at this time.   

## 2018-01-12 ENCOUNTER — Emergency Department (HOSPITAL_COMMUNITY): Payer: Medicaid Other

## 2018-01-12 ENCOUNTER — Encounter (HOSPITAL_COMMUNITY): Payer: Self-pay | Admitting: Emergency Medicine

## 2018-01-12 ENCOUNTER — Emergency Department (HOSPITAL_COMMUNITY)
Admission: EM | Admit: 2018-01-12 | Discharge: 2018-01-12 | Disposition: A | Discharge status: Home / Self Care | Payer: Medicaid Other | Attending: Emergency Medicine | Admitting: Emergency Medicine

## 2018-01-12 DIAGNOSIS — Z79899 Other long term (current) drug therapy: Secondary | ICD-10-CM | POA: Diagnosis not present

## 2018-01-12 DIAGNOSIS — R0789 Other chest pain: Secondary | ICD-10-CM | POA: Diagnosis not present

## 2018-01-12 DIAGNOSIS — R109 Unspecified abdominal pain: Secondary | ICD-10-CM | POA: Diagnosis present

## 2018-01-12 LAB — CBC
HCT: 41.8 % (ref 36.0–46.0)
HEMOGLOBIN: 13.9 g/dL (ref 12.0–15.0)
MCH: 31.5 pg (ref 26.0–34.0)
MCHC: 33.3 g/dL (ref 30.0–36.0)
MCV: 94.8 fL (ref 78.0–100.0)
Platelets: 225 10*3/uL (ref 150–400)
RBC: 4.41 MIL/uL (ref 3.87–5.11)
RDW: 12.1 % (ref 11.5–15.5)
WBC: 8.5 10*3/uL (ref 4.0–10.5)

## 2018-01-12 LAB — COMPREHENSIVE METABOLIC PANEL
ALBUMIN: 4.3 g/dL (ref 3.5–5.0)
ALT: 14 U/L (ref 0–44)
ANION GAP: 9 (ref 5–15)
AST: 18 U/L (ref 15–41)
Alkaline Phosphatase: 44 U/L (ref 38–126)
BUN: 10 mg/dL (ref 6–20)
CO2: 25 mmol/L (ref 22–32)
Calcium: 9.2 mg/dL (ref 8.9–10.3)
Chloride: 106 mmol/L (ref 98–111)
Creatinine, Ser: 0.75 mg/dL (ref 0.44–1.00)
GFR calc Af Amer: >60 mL/min (ref >60)
GFR calc non Af Amer: >60 mL/min (ref >60)
GLUCOSE: 94 mg/dL (ref 70–99)
POTASSIUM: 3.6 mmol/L (ref 3.5–5.1)
Sodium: 140 mmol/L (ref 135–145)
Total Bilirubin: 1.6 mg/dL — ABNORMAL HIGH (ref 0.3–1.2)
Total Protein: 6.9 g/dL (ref 6.5–8.1)

## 2018-01-12 LAB — URINALYSIS, ROUTINE W REFLEX MICROSCOPIC
Glucose, UA: NEGATIVE mg/dL
HGB URINE DIPSTICK: NEGATIVE
Ketones, ur: 20 mg/dL — AB
NITRITE: NEGATIVE
PROTEIN: 30 mg/dL — AB
Specific Gravity, Urine: 1.038 — ABNORMAL HIGH (ref 1.005–1.030)
pH: 5 (ref 5.0–8.0)

## 2018-01-12 LAB — I-STAT BETA HCG BLOOD, ED (MC, WL, AP ONLY)

## 2018-01-12 LAB — LIPASE, BLOOD: Lipase: 27 U/L (ref 11–51)

## 2018-01-12 MED ORDER — METHOCARBAMOL 500 MG PO TABS: 500.0000 mg | ORAL_TABLET | Freq: Every evening | ORAL | 0 refills | Status: DC | PRN

## 2018-01-12 MED ORDER — IBUPROFEN 800 MG PO TABS
800.0000 mg | ORAL_TABLET | Freq: Once | ORAL | Status: AC
  Administered 2018-01-12: 800 mg via ORAL
  Filled 2018-01-12: qty 1

## 2018-01-12 NOTE — ED Triage Notes (Signed)
Patient complains of worsening left sided abdominal pain since Saturday. Also reports diarrhea and vomiting. Patient alert, oriented, and ambulating independently with steady gait.

## 2018-01-12 NOTE — ED Notes (Signed)
Pt reprots her urine has become darker, denies any other urinary symptoms. C/o left abdominal pain X5-6 days

## 2018-01-12 NOTE — ED Provider Notes (Signed)
[facility] EMERGENCY [facility]PARTMENT Provider Note   CSN: [id number] Arrival date & time: 01/12/18  1117     History   Chief Complaint Chief Complaint  Patient presents with  . Abdominal Pain    HPI Katherine Hess is a 22 y.o. female ending for evaluation of left thoracic pain.  Patient states for the past 4 days, she has been having left thoracic pain.  Pain is worse with certain movements, such as bending forward, movement of her torso, and movement of her left arm.  She denies history of similar.  She denies fall, trauma, or injury.  She reports a mild nonproductive cough, which does not change her pain.  She denies shortness of breath.  She denies radiation into her chest or into her abdomen.  She denies nausea, vomiting, or diarrhea.  She denies anterior abdominal pain.  She denies flank pain.  She denies urinary symptoms including dysuria or hematuria.  She denies have normal bowel movements.  No fevers, chills, or pain on the right side.  She has been taking Tylenol without improvement in pain.  She denies other medical problems, takes no other medications daily.  HPI  Past Medical History:  Diagnosis Date  . Headache   . Seizures (HCC)    when she was a baby    Patient Active Problem List   Diagnosis Date Noted  . Normal labor 04/03/2017  . SVD (spontaneous vaginal delivery) 04/03/2017  . Chronic headache 01/29/2017    Past Surgical History:  Procedure Laterality Date  . NO PAST SURGERIES       OB History    Gravida  2   Para  2   Term  2   Preterm      AB      Living  2     SAB      TAB      Ectopic      Multiple  0   Live Births  2            Home Medications    Prior to Admission medications   Medication Sig Start Date End Date Taking? Authorizing Provider  gabapentin (NEURONTIN) 300 MG capsule Take 1 capsule (300 mg total) by mouth 3 (three) times daily. 04/20/17   Anson FretAhern, Antonia B, MD  ibuprofen (ADVIL,MOTRIN) 600  MG tablet Take 1 tablet (600 mg total) every 6 (six) hours as needed by mouth. 04/04/17   Arabella MerlesShaw, Kimberly D, CNM  methocarbamol (ROBAXIN) 500 MG tablet Take 1 tablet (500 mg total) by mouth at bedtime as needed for muscle spasms. 01/12/18   Kensie Susman, PA-C  Prenatal Vit-Fe Fumarate-FA (PRENATAL MULTIVITAMIN) TABS tablet Take 1 tablet by mouth at bedtime.    [provider]    Family History Family History  Problem Relation Age of Onset  . Hypertension Mother   . Diabetes Father   . Hypertension Sister   . Headache Neg Hx     Social History Social History   Tobacco Use  . Smoking status: Never Smoker  . Smokeless tobacco: Never Used  Substance Use Topics  . Alcohol use: No  . Drug use: No     Allergies   Patient has no known allergies.   Review of Systems Review of Systems  Respiratory: Positive for cough.   Cardiovascular: Positive for chest pain (Left-sided thorax pain).  All other systems reviewed and are negative.    Physical Exam Updated Vital Signs BP 119/80 (BP Location: Right  Arm)   Pulse 74   Temp 97.6 F (36.4 C) (Oral)   Resp 16   LMP 12/27/2017   SpO2 100%   Physical Exam  Constitutional: She is oriented to person, place, and time. She appears well-developed and well-nourished. No distress.  Resting comfortably in the bed in no acute distress  HENT:  Head: Normocephalic and atraumatic.  Eyes: Pupils are equal, round, and reactive to light. Conjunctivae and EOM are normal.  Neck: Normal range of motion. Neck supple.  Cardiovascular: Normal rate, regular rhythm and intact distal pulses.  Pulmonary/Chest: Effort normal and breath sounds normal. No respiratory distress. She has no wheezes. She exhibits tenderness.  Speaking in full sentences.  Clear lung sounds in all fields.  No pain with inspiration. Tenderness palpation of the lateral lower left chest wall.  No erythema, deformity, injury, or rash.  Abdominal: Soft. Bowel sounds are  normal. She exhibits no distension and no mass. There is no tenderness. There is no rebound and no guarding.  No tenderness palpation of the abdomen.  Soft without rigidity, guarding, or distention. No CVA tenderness  Musculoskeletal: Normal range of motion. She exhibits tenderness.  Increased pain of the left lateral ribs with movement of the left arm.  Full active range of motion of the arm.  Full active range of motion of the hips, increased pain with movement side to side and bending forward.  Patient is ambulatory.  Neurological: She is alert and oriented to person, place, and time. No sensory deficit.  Skin: Skin is warm and dry. Capillary refill takes less than 2 seconds.  Psychiatric: She has a normal mood and affect.  Nursing note and vitals reviewed.    ED Treatments / Results  Labs (all labs ordered are listed, but only abnormal results are displayed) Labs Reviewed  COMPREHENSIVE METABOLIC PANEL - Abnormal; Notable for the following components:      Result Value   Total Bilirubin 1.6 (*)    All other components within normal limits  URINALYSIS, ROUTINE W REFLEX MICROSCOPIC - Abnormal; Notable for the following components:   Color, Urine AMBER (*)    APPearance CLOUDY (*)    Specific Gravity, Urine 1.038 (*)    Bilirubin Urine SMALL (*)    Ketones, ur 20 (*)    Protein, ur 30 (*)    Leukocytes, UA TRACE (*)    Bacteria, UA RARE (*)    All other components within normal limits  LIPASE, BLOOD  CBC  I-STAT BETA HCG BLOOD, ED ([facility], [facility], [facility] ONLY)    EKG None  Radiology Dg Chest 2 View  Result Date: 01/12/2018 CLINICAL DATA:  22 y/o F; one week of left-sided chest pain with dizziness. EXAM: CHEST - 2 VIEW COMPARISON:  07/09/2016 chest radiograph FINDINGS: Stable heart size and mediastinal contours are within normal limits. Both lungs are clear. The visualized skeletal structures are unremarkable. IMPRESSION: No acute pulmonary process identified. Electronically Signed   By:  Mitzi HansenLance  Furusawa-Stratton M.D.   On: 01/12/2018 13:52    Procedures Procedures (including critical care time)  Medications Ordered in ED Medications  ibuprofen (ADVIL,MOTRIN) tablet 800 mg (800 mg Oral Given 01/12/18 1431)     Initial Impression / Assessment and Plan / ED Course  I have reviewed the triage vital signs and the nursing notes.  Pertinent labs & imaging results that were available during my care of the patient were reviewed by me and considered in my medical decision making (see chart for details).  Patient presenting for evaluation of left side pain.  Physical exam reassuring, she is afebrile not tachycardic.  Appears nontoxic.  Pain is reproducible with palpation of the musculature.  Worse with movement of the arm and the torso.  Likely musculoskeletal, however will obtain chest x-ray to rule out infection, as patient reports a new cough.  Abdominal exam reassuring, no tenderness palpation of the abdomen.  Doubt intra-abdominal infection.  Labs reassuring, no leukocytosis, kidney, liver, and pancreatic function reassuring.  Doubt surgical abdomen.  No CVA tenderness, however will obtain UA for further evaluation.  UA is a dirty specimen, rare bacteria with high number squamous epithelial.  Shows mild ketones and protein.  Discussed findings with patient.  Discussed importance of staying well-hydrated, and follow-up for reevaluation of her urine.  No urinary symptoms, doubt UTI.  Chest x-ray viewed and interpreted by me, no pneumonia, pneumothorax, effusions, or cardiomegaly.  No bony injury.  As pain is reproducible with palpation of the musculature and worse with movement, likely musculoskeletal.  Doubt kidney etiology, intra-abdominal etiology, or cardiac etiology.  Doubt infection.  Discussed findings with patient.  Discussed follow-up with [place] and wellness for further evaluation of urine and recheck of her symptoms.  Will treat for muscular skeletal pain with NSAIDs,  muscle relaxers, muscle creams.  At this time, patient appears safe for discharge.  Return precautions given.  Patient states she understands and agrees to plan.  Final Clinical Impressions(s) / ED Diagnoses   Final diagnoses:  Left-sided chest wall pain    ED Discharge Orders         Ordered    methocarbamol (ROBAXIN) 500 MG tablet  At bedtime PRN     01/12/18 1501           Cynde Menard, PA-C 01/12/18 1620    Pricilla Loveless, MD 01/12/18 2236

## 2018-01-12 NOTE — Discharge Instructions (Addendum)
Take ibuprofen 3 times a day with meals.  Do not take other anti-inflammatories at the same time (Advil, Motrin, naproxen, Aleve). You may supplement with Tylenol if you need further pain control. Use Robaxin as needed for muscle stiffness or soreness. Have caution, as this may make you tired or groggy. Do not drive or operate heavy machinery while taking this medication.  Use muscle creams (bengay, icy hot, salonpas) as needed for pain.  Follow up with [place] and wellness for further evaluation of your pain and reassessment of your urine.  Return to the ER if you develop high fevers, severe abdominal pain, severe chest pain, difficulty breathing, or any new or concerning symptoms.

## 2018-01-16 ENCOUNTER — Other Ambulatory Visit: Payer: Self-pay

## 2018-01-16 ENCOUNTER — Encounter (HOSPITAL_COMMUNITY): Payer: Self-pay | Admitting: *Deleted

## 2018-01-16 ENCOUNTER — Emergency Department (HOSPITAL_COMMUNITY)
Admission: EM | Admit: 2018-01-16 | Discharge: 2018-01-17 | Disposition: A | Discharge status: Home / Self Care | Payer: Medicaid Other | Attending: Emergency Medicine | Admitting: Emergency Medicine

## 2018-01-16 DIAGNOSIS — S0083XA Contusion of other part of head, initial encounter: Secondary | ICD-10-CM | POA: Diagnosis not present

## 2018-01-16 DIAGNOSIS — R55 Syncope and collapse: Secondary | ICD-10-CM | POA: Insufficient documentation

## 2018-01-16 DIAGNOSIS — M791 Myalgia, unspecified site: Secondary | ICD-10-CM | POA: Diagnosis not present

## 2018-01-16 DIAGNOSIS — Y9389 Activity, other specified: Secondary | ICD-10-CM | POA: Insufficient documentation

## 2018-01-16 DIAGNOSIS — Y92410 Unspecified street and highway as the place of occurrence of the external cause: Secondary | ICD-10-CM | POA: Insufficient documentation

## 2018-01-16 DIAGNOSIS — M7918 Myalgia, other site: Secondary | ICD-10-CM

## 2018-01-16 DIAGNOSIS — S0993XA Unspecified injury of face, initial encounter: Secondary | ICD-10-CM | POA: Diagnosis present

## 2018-01-16 DIAGNOSIS — Z79899 Other long term (current) drug therapy: Secondary | ICD-10-CM | POA: Diagnosis not present

## 2018-01-16 DIAGNOSIS — Y999 Unspecified external cause status: Secondary | ICD-10-CM | POA: Insufficient documentation

## 2018-01-16 DIAGNOSIS — M542 Cervicalgia: Secondary | ICD-10-CM | POA: Diagnosis not present
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
## 2018-01-17 ENCOUNTER — Emergency Department (HOSPITAL_COMMUNITY): Payer: Medicaid Other

## 2018-01-17 ENCOUNTER — Encounter (HOSPITAL_COMMUNITY): Payer: Self-pay | Admitting: *Deleted

## 2018-01-17 MED ORDER — HYDROCODONE-ACETAMINOPHEN 5-325 MG PO TABS
1.0000 | ORAL_TABLET | Freq: Once | ORAL | Status: AC
  Administered 2018-01-17: 1 via ORAL
  Filled 2018-01-17: qty 1

## 2018-01-17 MED ORDER — IBUPROFEN 200 MG PO TABS
600.0000 mg | ORAL_TABLET | Freq: Once | ORAL | Status: AC
  Administered 2018-01-17: 600 mg via ORAL
  Filled 2018-01-17: qty 3

## 2018-01-17 MED ORDER — IBUPROFEN 600 MG PO TABS: 600.0000 mg | ORAL_TABLET | Freq: Four times a day (QID) | ORAL | 0 refills | Status: DC | PRN

## 2018-01-17 MED ORDER — CYCLOBENZAPRINE HCL 10 MG PO TABS: 10.0000 mg | ORAL_TABLET | Freq: Two times a day (BID) | ORAL | 0 refills | Status: DC | PRN
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
## 2018-01-17 NOTE — ED Notes (Signed)
Pt in radiology 

## 2018-04-17 IMAGING — MR MR HEAD W/O CM
9 series · 45 of 48 positions shown · non-contrast
Comparison: none

[Series 2: T1 · sagittal · 5.0mm · 0.45mm/px · 2 of 21 slices shown]
[im 1/21]
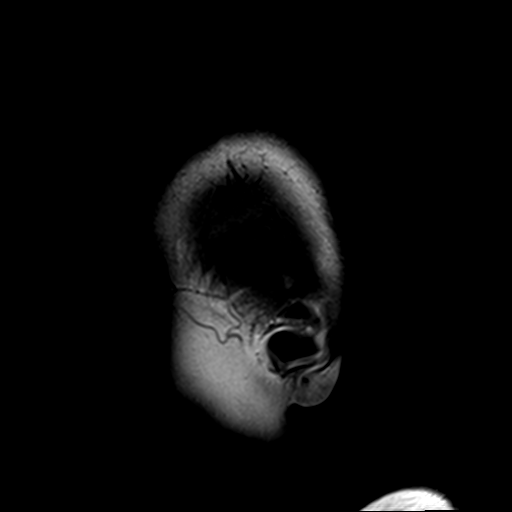
[im 21/21]
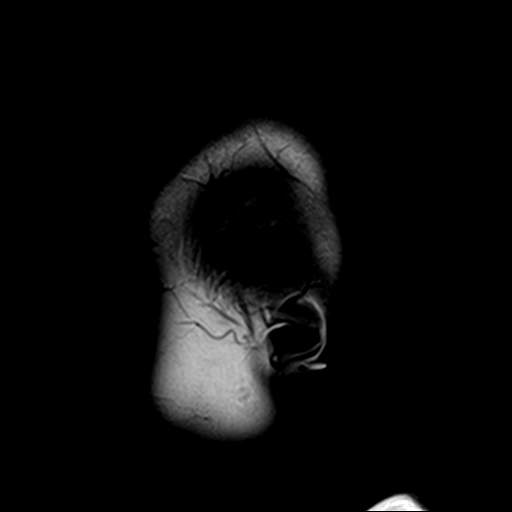

[Series 3: DWI · axial · 3.0mm · 1.80mm/px · z∈[-66,+75]mm · 9 of 100 slices shown (1 of 2)]
[im 1/100]
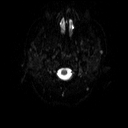
[im 13/100]
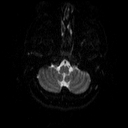
[im 25/100]
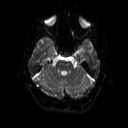
[im 38/100]
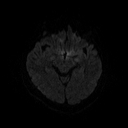
[im 50/100]
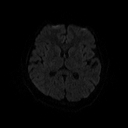
[im 62/100]
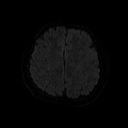
[im 75/100]
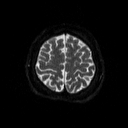
[im 87/100]
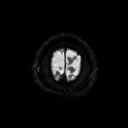
[im 100/100]
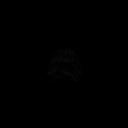

[Series 4: DWI · axial · 3.0mm · 1.80mm/px · z∈[-66,+75]mm · 5 of 50 slices shown (2 of 2)]
[im 1/50]
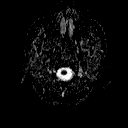
[im 13/50]
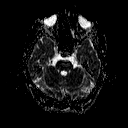
[im 25/50]
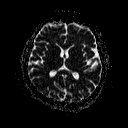
[im 37/50]
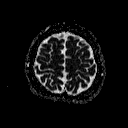
[im 50/50]
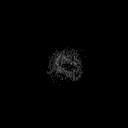

[Series 5: T2 · axial · 5.0mm · 0.51mm/px · z∈[-65,+76]mm · 2 of 22 slices shown (1 of 2)]
[im 1/22]
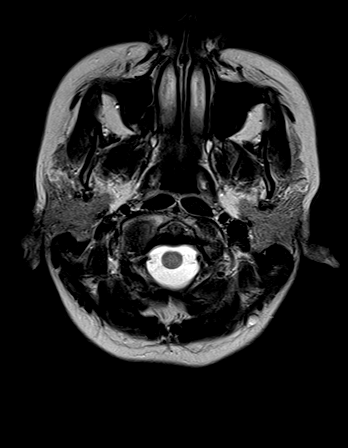
[im 22/22]
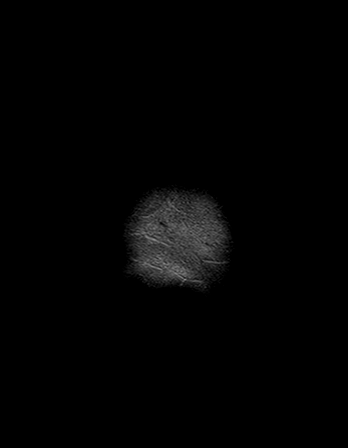

[Series 6: FLAIR · axial · 3.0mm · 0.45mm/px · z∈[-65,+74]mm · 3 of 32 slices shown]
[im 1/32]
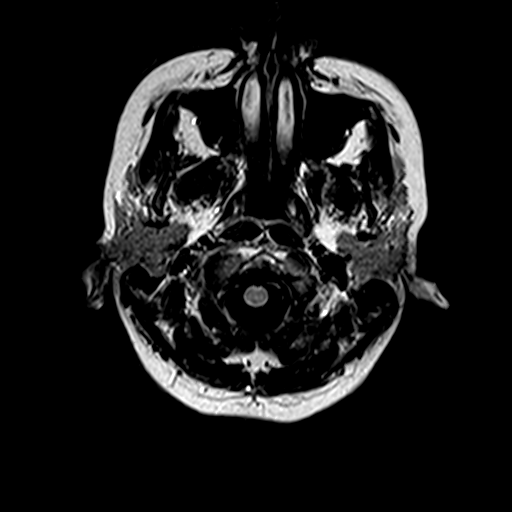
[im 16/32]
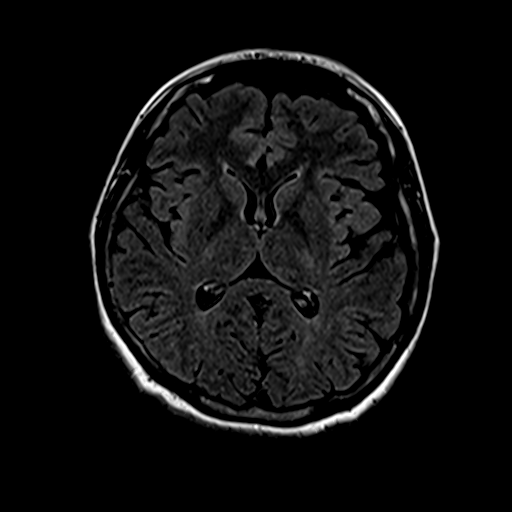
[im 32/32]
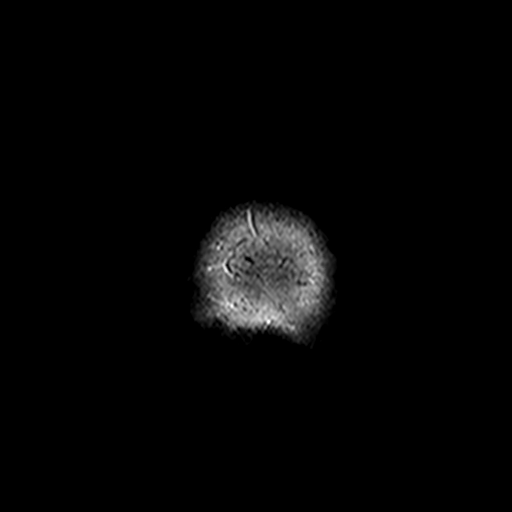

[Series 8: swi_images · axial · 5.0mm · 0.90mm/px · z∈[-65,+75]mm · 3 of 30 slices shown]
[im 1/30]
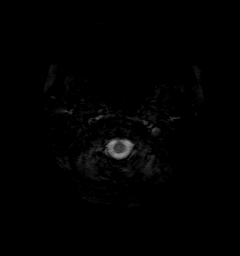
[im 15/30]
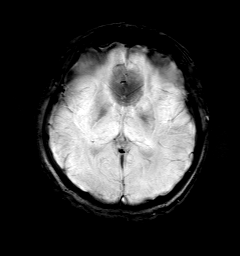
[im 30/30]
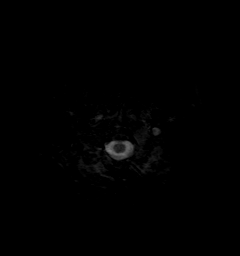

[Series 9: t1_mpr_tra · axial · 1.0mm · 0.71mm/px · z∈[-63,+75]mm · 11 of 144 slices shown]
[im 1/144]
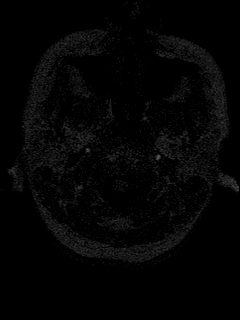
[im 12/144]
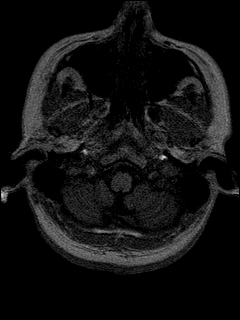
[im 24/144]
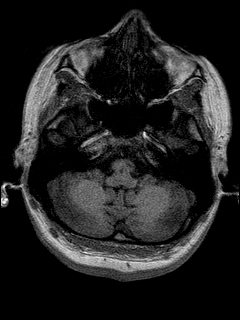
[im 36/144]
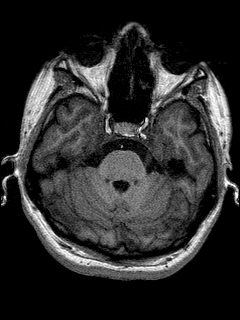
[im 48/144]
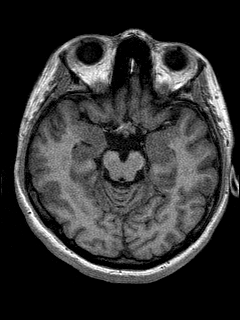
[im 60/144]
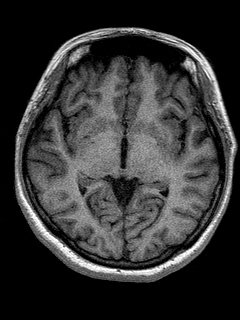
[im 72/144]
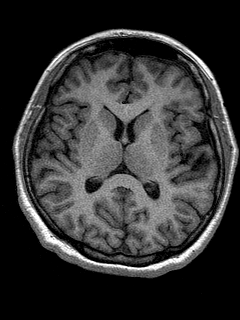
[im 84/144]
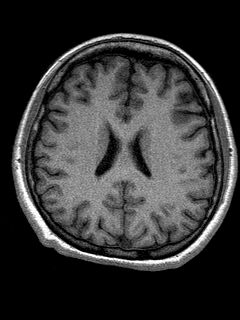
[im 96/144]
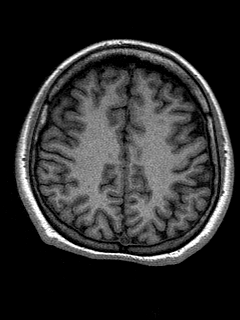
[im 120/144]
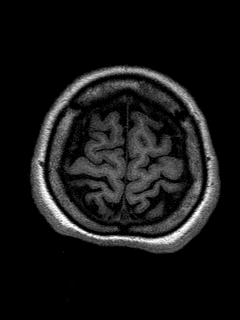
[im 144/144]
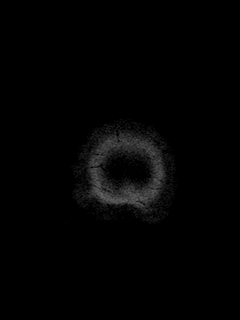

[Series 10: T2 · coronal · 5.0mm · 0.45mm/px · 2 of 25 slices shown (2 of 2)]
[im 1/25]
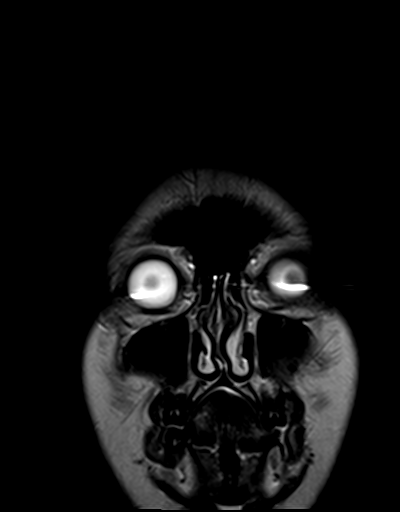
[im 25/25]
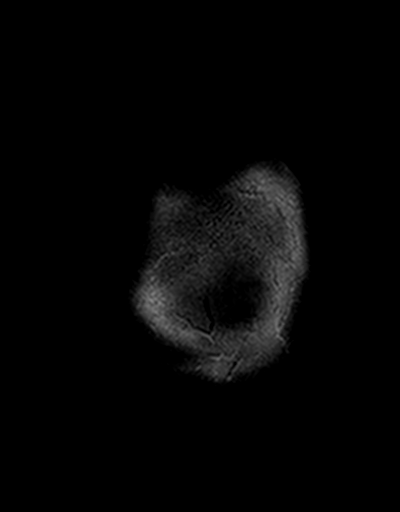

[Series 15: (id) coronal · coronal · 3.0mm · 0.49mm/px · 8 of 93 slices shown]
[im 1/93]
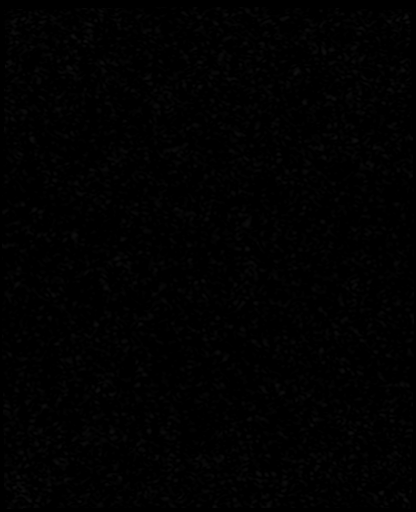
[im 12/93]
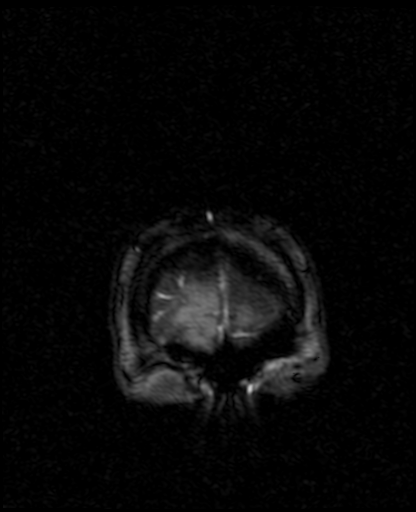
[im 24/93]
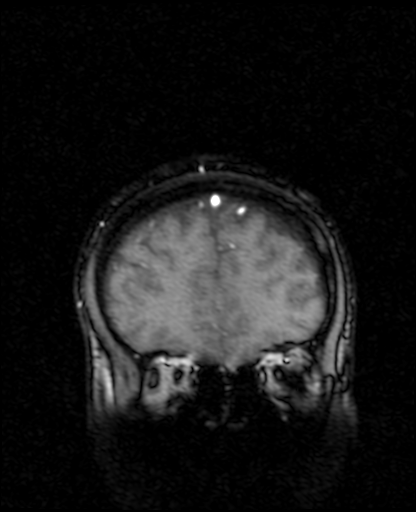
[im 35/93]
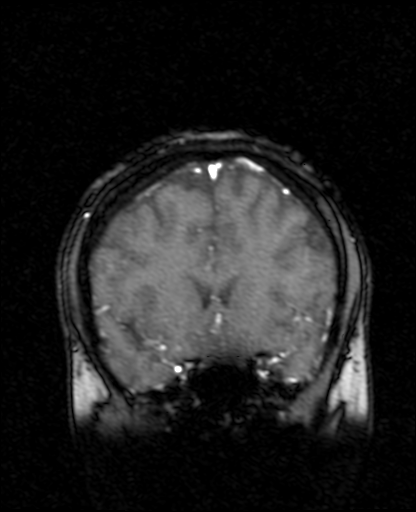
[im 58/93]
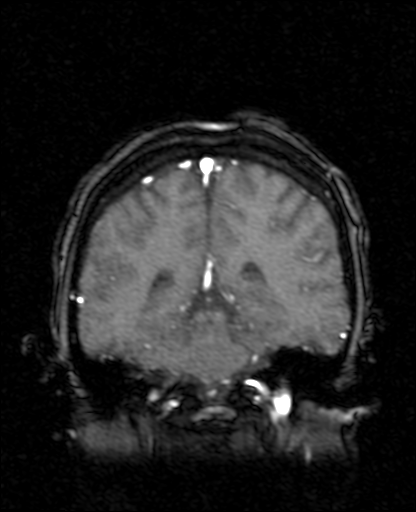
[im 70/93]
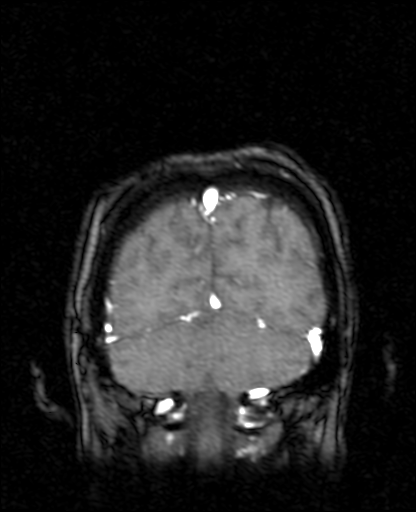
[im 81/93]
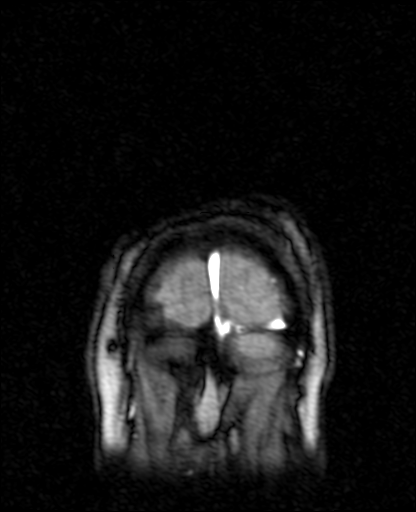
[im 93/93]
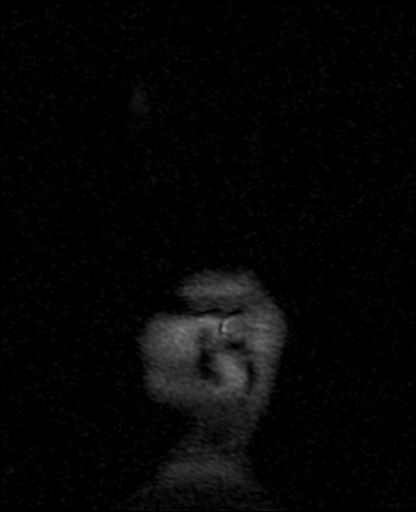

[45 of 48 positions shown; findings below may reference images not displayed]

Canned report from images found in remote index.

Refer to host system for actual result text.

## 2019-12-20 ENCOUNTER — Emergency Department (HOSPITAL_COMMUNITY)
Admission: EM | Admit: 2019-12-20 | Discharge: 2019-12-20 | Disposition: A | Discharge status: Home / Self Care | Payer: Medicaid Other | Attending: Emergency Medicine | Admitting: Emergency Medicine

## 2019-12-20 ENCOUNTER — Other Ambulatory Visit: Payer: Self-pay

## 2019-12-20 ENCOUNTER — Encounter (HOSPITAL_COMMUNITY): Payer: Self-pay

## 2019-12-20 DIAGNOSIS — R1013 Epigastric pain: Secondary | ICD-10-CM | POA: Diagnosis not present

## 2019-12-20 LAB — COMPREHENSIVE METABOLIC PANEL
ALT: 14 U/L (ref 0–44)
AST: 15 U/L (ref 15–41)
Albumin: 3.9 g/dL (ref 3.5–5.0)
Alkaline Phosphatase: 42 U/L (ref 38–126)
Anion gap: 7 (ref 5–15)
BUN: 11 mg/dL (ref 6–20)
CO2: 26 mmol/L (ref 22–32)
Calcium: 9.3 mg/dL (ref 8.9–10.3)
Chloride: 106 mmol/L (ref 98–111)
Creatinine, Ser: 0.56 mg/dL (ref 0.44–1.00)
GFR calc Af Amer: >60 mL/min (ref >60)
GFR calc non Af Amer: >60 mL/min (ref >60)
Glucose, Bld: 105 mg/dL — ABNORMAL HIGH (ref 70–99)
Potassium: 3.6 mmol/L (ref 3.5–5.1)
Sodium: 139 mmol/L (ref 135–145)
Total Bilirubin: 0.7 mg/dL (ref 0.3–1.2)
Total Protein: 6.5 g/dL (ref 6.5–8.1)

## 2019-12-20 LAB — CBC
HCT: 41.3 % (ref 36.0–46.0)
Hemoglobin: 13.5 g/dL (ref 12.0–15.0)
MCH: 31.3 pg (ref 26.0–34.0)
MCHC: 32.7 g/dL (ref 30.0–36.0)
MCV: 95.6 fL (ref 80.0–100.0)
Platelets: 237 10*3/uL (ref 150–400)
RBC: 4.32 MIL/uL (ref 3.87–5.11)
RDW: 12.2 % (ref 11.5–15.5)
WBC: 8.5 10*3/uL (ref 4.0–10.5)
nRBC: 0 % (ref 0.0–0.2)

## 2019-12-20 LAB — URINALYSIS, ROUTINE W REFLEX MICROSCOPIC
Bilirubin Urine: NEGATIVE
Glucose, UA: NEGATIVE mg/dL
Hgb urine dipstick: NEGATIVE
Ketones, ur: NEGATIVE mg/dL
Leukocytes,Ua: NEGATIVE
Nitrite: NEGATIVE
Protein, ur: NEGATIVE mg/dL
Specific Gravity, Urine: 1.026 (ref 1.005–1.030)
pH: 5 (ref 5.0–8.0)

## 2019-12-20 LAB — I-STAT BETA HCG BLOOD, ED (MC, WL, AP ONLY): I-stat hCG, quantitative: <5 m[IU]/mL (ref <5)

## 2019-12-20 LAB — LIPASE, BLOOD: Lipase: 33 U/L (ref 11–51)

## 2019-12-20 MED ORDER — DICYCLOMINE HCL 10 MG PO CAPS
10.0000 mg | ORAL_CAPSULE | Freq: Once | ORAL | Status: AC
  Administered 2019-12-20: 10 mg via ORAL
  Filled 2019-12-20: qty 1

## 2019-12-20 MED ORDER — ONDANSETRON HCL 4 MG PO TABS: 4.0000 mg | ORAL_TABLET | Freq: Three times a day (TID) | ORAL | 0 refills | Status: DC | PRN

## 2019-12-20 MED ORDER — DICYCLOMINE HCL 20 MG PO TABS
20.0000 mg | ORAL_TABLET | Freq: Two times a day (BID) | ORAL | 0 refills | Status: DC
Start: 2019-12-20 — End: 2021-06-27

## 2019-12-20 MED ORDER — ALUM & MAG HYDROXIDE-SIMETH 200-200-20 MG/5ML PO SUSP
30.0000 mL | Freq: Once | ORAL | Status: AC
  Administered 2019-12-20: 30 mL via ORAL
  Filled 2019-12-20: qty 30

## 2019-12-20 MED ORDER — LIDOCAINE VISCOUS HCL 2 % MT SOLN
15.0000 mL | Freq: Once | OROMUCOSAL | Status: AC
  Administered 2019-12-20: 15 mL via ORAL
  Filled 2019-12-20: qty 15

## 2019-12-20 MED ORDER — FAMOTIDINE 20 MG PO TABS
20.0000 mg | ORAL_TABLET | Freq: Two times a day (BID) | ORAL | 0 refills | Status: DC
Start: 2019-12-20 — End: 2021-06-27

## 2019-12-20 MED ORDER — SODIUM CHLORIDE 0.9% FLUSH: 3.0000 mL | Freq: Once | INTRAVENOUS | Status: DC

## 2019-12-20 NOTE — ED Provider Notes (Signed)
[facility] EMERGENCY DEPARTMENT Provider Note   CSN: [id number] Arrival date & time: 12/20/19  1410     History Chief Complaint  Patient presents with  . Abdominal Pain    Katherine Hess is a 24 y.o. female.  Who presents emergency department with chief complaint of epigastric abdominal pain.  Patient states she had onset of epigastric abdominal pain yesterday evening.  Throughout the day the pain was more severe.  She had couple episodes of vomiting.  She denies diarrhea or constipation.  Nothing seems to make the pain worse or better.  The pain does not radiate anywhere.  She has not tried any medications prior to arrival.  She denies a history of abdominal surgeries.  She does complain of reflux symptoms.  She denies any use of alcohol or anti-inflammatory medications.  She denies chest pain, shortness of breath or fever.  She denies urinary symptoms.  HPI     Past Medical History:  Diagnosis Date  . Headache     Patient Active Problem List   Diagnosis Date Noted  . Normal labor 04/03/2017  . SVD (spontaneous vaginal delivery) 04/03/2017  . Chronic headache 01/29/2017    Past Surgical History:  Procedure Laterality Date  . NO PAST SURGERIES       OB History    Gravida  2   Para  2   Term  2   Preterm      AB      Living  2     SAB      TAB      Ectopic      Multiple  0   Live Births  2           Family History  Problem Relation Age of Onset  . Hypertension Mother   . Diabetes Father   . Hypertension Sister   . Headache Neg Hx     Social History   Tobacco Use  . Smoking status: Never Smoker  . Smokeless tobacco: Never Used  Substance Use Topics  . Alcohol use: No  . Drug use: No    Home Medications Prior to Admission medications   Medication Sig Start Date End Date Taking? Authorizing Provider  acetaminophen (TYLENOL) 500 MG tablet Take 500 mg by mouth every 6 (six) hours as needed for headache.   Yes  [provider]  cyclobenzaprine (FLEXERIL) 10 MG tablet Take 1 tablet (10 mg total) by mouth 2 (two) times daily as needed for muscle spasms. Patient not taking: Reported on 12/20/2019 01/17/18   Elpidio Anis, PA-C  gabapentin (NEURONTIN) 300 MG capsule Take 1 capsule (300 mg total) by mouth 3 (three) times daily. Patient not taking: Reported on 01/17/2018 04/20/17   Anson Fret, MD  ibuprofen (ADVIL,MOTRIN) 600 MG tablet Take 1 tablet (600 mg total) by mouth every 6 (six) hours as needed. Patient not taking: Reported on 12/20/2019 01/17/18   Elpidio Anis, PA-C  methocarbamol (ROBAXIN) 500 MG tablet Take 1 tablet (500 mg total) by mouth at bedtime as needed for muscle spasms. Patient not taking: Reported on 01/17/2018 01/12/18   Caccavale, Sophia, PA-C    Allergies    Patient has no known allergies.  Review of Systems   Review of Systems Ten systems reviewed and are negative for acute change, except as noted in the HPI.   Physical Exam Updated Vital Signs BP 115/74   Pulse 88   Temp 98.5 F (36.9 C) (Oral)   Resp  16   Ht 5\' 6"  (1.676 m)   Wt (!) 97.5 kg   SpO2 100%   BMI 34.70 kg/m   Physical Exam Physical Exam  Nursing note and vitals reviewed. Constitutional: She is oriented to person, place, and time. She appears well-developed and well-nourished. No distress.  HENT:  Head: Normocephalic and atraumatic.  Eyes: Conjunctivae normal and EOM are normal. Pupils are equal, round, and reactive to light. No scleral icterus.  Neck: Normal range of motion.  Cardiovascular: Normal rate, regular rhythm and normal heart sounds.  Exam reveals no gallop and no friction rub.   No murmur heard. Pulmonary/Chest: Effort normal and breath sounds normal. No respiratory distress.  Abdominal: Soft. Bowel sounds are normal. She exhibits no distension and no mass. There is no tenderness. There is no guarding.  Neurological: She is alert and oriented to person, place, and time.    Skin: Skin is warm and dry. She is not diaphoretic.    ED Results / Procedures / Treatments   Labs (all labs ordered are listed, but only abnormal results are displayed) Labs Reviewed  COMPREHENSIVE METABOLIC PANEL - Abnormal; Notable for the following components:      Result Value   Glucose, Bld 105 (*)    All other components within normal limits  LIPASE, BLOOD  CBC  URINALYSIS, ROUTINE W REFLEX MICROSCOPIC  I-STAT BETA HCG BLOOD, ED ([facility], [facility], [facility] ONLY)    EKG None  Radiology No results found.  Procedures Procedures (including critical care time)  Medications Ordered in ED Medications  sodium chloride flush (NS) 0.9 % injection 3 mL (3 mLs Intravenous Not Given 12/20/19 1858)  alum & mag hydroxide-simeth (MAALOX/MYLANTA) 200-200-20 MG/5ML suspension 30 mL (30 mLs Oral Given 12/20/19 1925)    And  lidocaine (XYLOCAINE) 2 % viscous mouth solution 15 mL (15 mLs Oral Given 12/20/19 1925)  dicyclomine (BENTYL) capsule 10 mg (10 mg Oral Given 12/20/19 1925)    ED Course  I have reviewed the triage vital signs and the nursing notes.  Pertinent labs & imaging results that were available during my care of the patient were reviewed by me and considered in my medical decision making (see chart for details).    MDM Rules/Calculators/A&P                          CC: Epigastric abdominal pain VS:  Vitals:   12/20/19 1906 12/20/19 1907 12/20/19 1931 12/20/19 2146  BP:   115/74 123/74  Pulse: 79 73 88 79  Resp:   16 16  Temp:      TempSrc:      SpO2: 100% 100% 100% 100%  Weight:      Height:        2147 is gathered by patient and EMR. Previous records obtained and reviewed. DDX:The patient's complaint of epigastric abdominal involves an extensive number of diagnostic and treatment options, and is a complaint that carries with it a high risk of complications, morbidity, and potential mortality. Given the large differential diagnosis, medical decision making is of high  complexity. Differential diagnosis of epigastric pain includes: Functional or nonulcer dyspepsia, PUD, GERD, Gastritis, (NSAIDs, alcohol, stress, H. pylori, pernicious anemia), pancreatitis or pancreatic cancer, overeating indigestion (high-fat foods, coffee), drugs (aspirin, antibiotics (eg, macrolides, metronidazole), corticosteroids, digoxin, narcotics, theophylline), gastroparesis, lactose intolerance, malabsorption gastric cancer, parasitic infection, (Giardia, Strongyloides, Ascaris) cholelithiasis, choledocholithiasis, or cholangitis, ACS, pericarditis, pneumonia, abdominal hernia, pregnancy, intestinal ischemia, esophageal rupture, gastric volvulus,  hepatitis.  Labs: I ordered reviewed and interpreted labs which include CMP which shows mildly elevated glucose of insignificant value, hCG is negative, CBC lipase and urine within normal limits Imaging:  EKG: Consults: MDM: Patient with epigastric abdominal pain.  This may be related to gastritis or reflux symptoms.  She has a benign exam without any abdominal tenderness.  Labs are reassuring.  Patient given GI cocktail with significant improvement in her symptoms.  No active vomiting here in the emergency department.  Patient will be discharged with Bentyl and Pepcid.  She appears otherwise appropriate for discharge.  Discussed return precautions. Patient disposition: The patient appears reasonably screened and/or stabilized for discharge and I doubt any other medical condition or other [facility] requiring further screening, evaluation, or treatment in the ED at this time prior to discharge. I have discussed lab and/or imaging findings with the patient and answered all questions/concerns to the best of my ability.I have discussed return precautions and OP follow up.    Final Clinical Impression(s) / ED Diagnoses Final diagnoses:  None    Rx / DC Orders ED Discharge Orders    None       Arthor Captain, PA-C 12/21/19 1317    Virgina Norfolk,  DO 12/21/19 1847

## 2019-12-20 NOTE — ED Triage Notes (Signed)
Pt arrives to ED w/ c/o 5/10 R/LUQ abdominal pain x 1 day. Pt endorses n/v. Pt denies chest pain, sob, diarrhea.

## 2019-12-20 NOTE — Discharge Instructions (Signed)

## 2020-04-21 ENCOUNTER — Emergency Department (HOSPITAL_COMMUNITY)
Admission: EM | Admit: 2020-04-21 | Discharge: 2020-04-21 | Disposition: A | Discharge status: Home / Self Care | Payer: Medicaid Other | Attending: Emergency Medicine | Admitting: Emergency Medicine

## 2020-04-21 ENCOUNTER — Encounter (HOSPITAL_COMMUNITY): Payer: Self-pay | Admitting: Emergency Medicine

## 2020-04-21 ENCOUNTER — Emergency Department (HOSPITAL_COMMUNITY): Payer: Medicaid Other

## 2020-04-21 DIAGNOSIS — R0602 Shortness of breath: Secondary | ICD-10-CM | POA: Diagnosis not present

## 2020-04-21 DIAGNOSIS — Y990 Civilian activity done for income or pay: Secondary | ICD-10-CM | POA: Insufficient documentation

## 2020-04-21 DIAGNOSIS — S3992XA Unspecified injury of lower back, initial encounter: Secondary | ICD-10-CM | POA: Diagnosis present

## 2020-04-21 DIAGNOSIS — H6091 Unspecified otitis externa, right ear: Secondary | ICD-10-CM | POA: Insufficient documentation

## 2020-04-21 DIAGNOSIS — S39012A Strain of muscle, fascia and tendon of lower back, initial encounter: Secondary | ICD-10-CM | POA: Diagnosis not present

## 2020-04-21 DIAGNOSIS — Y93E5 Activity, floor mopping and cleaning: Secondary | ICD-10-CM | POA: Diagnosis not present

## 2020-04-21 DIAGNOSIS — T148XXA Other injury of unspecified body region, initial encounter: Secondary | ICD-10-CM

## 2020-04-21 DIAGNOSIS — X501XXA Overexertion from prolonged static or awkward postures, initial encounter: Secondary | ICD-10-CM | POA: Diagnosis not present

## 2020-04-21 DIAGNOSIS — H60501 Unspecified acute noninfective otitis externa, right ear: Secondary | ICD-10-CM

## 2020-04-21 LAB — CBC WITH DIFFERENTIAL/PLATELET
Abs Immature Granulocytes: 0.02 10*3/uL (ref 0.00–0.07)
Basophils Absolute: 0.1 10*3/uL (ref 0.0–0.1)
Basophils Relative: 1 %
Eosinophils Absolute: 0.2 10*3/uL (ref 0.0–0.5)
Eosinophils Relative: 2 %
HCT: 40.7 % (ref 36.0–46.0)
Hemoglobin: 13.6 g/dL (ref 12.0–15.0)
Immature Granulocytes: 0 %
Lymphocytes Relative: 17 %
Lymphs Abs: 1.7 10*3/uL (ref 0.7–4.0)
MCH: 32 pg (ref 26.0–34.0)
MCHC: 33.4 g/dL (ref 30.0–36.0)
MCV: 95.8 fL (ref 80.0–100.0)
Monocytes Absolute: 0.8 10*3/uL (ref 0.1–1.0)
Monocytes Relative: 9 %
Neutro Abs: 6.8 10*3/uL (ref 1.7–7.7)
Neutrophils Relative %: 71 %
Platelets: 242 10*3/uL (ref 150–400)
RBC: 4.25 MIL/uL (ref 3.87–5.11)
RDW: 12.2 % (ref 11.5–15.5)
WBC: 9.6 10*3/uL (ref 4.0–10.5)
nRBC: 0 % (ref 0.0–0.2)

## 2020-04-21 LAB — COMPREHENSIVE METABOLIC PANEL
ALT: 17 U/L (ref 0–44)
AST: 16 U/L (ref 15–41)
Albumin: 3.7 g/dL (ref 3.5–5.0)
Alkaline Phosphatase: 36 U/L — ABNORMAL LOW (ref 38–126)
Anion gap: 9 (ref 5–15)
BUN: 8 mg/dL (ref 6–20)
CO2: 23 mmol/L (ref 22–32)
Calcium: 9.1 mg/dL (ref 8.9–10.3)
Chloride: 108 mmol/L (ref 98–111)
Creatinine, Ser: 0.66 mg/dL (ref 0.44–1.00)
GFR, Estimated: >60 mL/min (ref >60)
Glucose, Bld: 102 mg/dL — ABNORMAL HIGH (ref 70–99)
Potassium: 3.9 mmol/L (ref 3.5–5.1)
Sodium: 140 mmol/L (ref 135–145)
Total Bilirubin: 1.2 mg/dL (ref 0.3–1.2)
Total Protein: 6.5 g/dL (ref 6.5–8.1)

## 2020-04-21 MED ORDER — NEOMYCIN-POLYMYXIN-HC 3.5-10000-1 OT SUSP
4.0000 [drp] | Freq: Three times a day (TID) | OTIC | 0 refills | Status: DC
Start: 2020-04-21 — End: 2021-06-27

## 2020-04-21 NOTE — ED Provider Notes (Signed)
[facility] EMERGENCY DEPARTMENT Provider Note   CSN: [id number] Arrival date & time: 04/21/20  0840     History Chief Complaint  Patient presents with  . Otalgia    Katherine Hess is a 24 y.o. female with no pertinent past medical history that presents the emerge department today for otalgia and shortness of breath/back pain. She did not mention latter to triage nurse.  Patient is Spanish-speaking and medical interpreter was used.  Patient states that she has had ear pain on the right side for about a month, states that it started itching around her ear and then it started hurting on the outer side of her ear.  Denies any discharge, does admit to some hearing loss on that side. Denies HA, tinnitus, dizziness.   Patient states that has been the same for about a month, denies any trauma to the area, denies any recent swimming.  Has not tried anything for this besides Q-tips which have not been working.  States that her shortness of breath started on Friday, describes it more as of a " feeling that there is something in her lungs," however pt points to her back.  States that the upper part of her back hurts for the past couple of days, therefore she thinks that there is something in her lungs causing her to be short of breath. When clarified, pt states that is more back pain than SOB.  Denies any chest pain.  Denies any recent surgeries, chance of pregnancy, calf pain, calf swelling, hemoptysis, coagulation disorder.  No OCPs.  Patient denies any cough, URI symptoms.  Has been vaccinated against Covid, denies sick contacts.  States that she was in normal health before this.  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  Has not seen anyone about either of these.  Has not taken anything for this.  Denies any fevers, chills, nausea, vomiting, abdominal pain, dysuria, vaginal symptoms.  She states she is been eating and drinking normally.  No  diarrhea.  HPI     Past Medical History:  Diagnosis Date  . Headache     Patient Active Problem List   Diagnosis Date Noted  . Normal labor 04/03/2017  . SVD (spontaneous vaginal delivery) 04/03/2017  . Chronic headache 01/29/2017    Past Surgical History:  Procedure Laterality Date  . NO PAST SURGERIES       OB History    Gravida  2   Para  2   Term  2   Preterm      AB      Living  2     SAB      TAB      Ectopic      Multiple  0   Live Births  2           Family History  Problem Relation Age of Onset  . Hypertension Mother   . Diabetes Father   . Hypertension Sister   . Headache Neg Hx     Social History   Tobacco Use  . Smoking status: Never Smoker  . Smokeless tobacco: Never Used  Substance Use Topics  . Alcohol use: No  . Drug use: No    Home Medications Prior to Admission medications   Medication Sig Start Date End Date Taking? Authorizing Provider  acetaminophen (TYLENOL) 500 MG tablet Take 500 mg by mouth every 6 (six) hours as needed for headache.   Yes  [provider]  cyclobenzaprine (FLEXERIL) 10 MG tablet Take 1 tablet (10 mg total) by mouth 2 (two) times daily as needed for muscle spasms. Patient not taking: Reported on 12/20/2019 01/17/18   Elpidio AnisUpstill, Shari, PA-C  dicyclomine (BENTYL) 20 MG tablet Take 1 tablet (20 mg total) by mouth 2 (two) times daily. Patient not taking: Reported on 04/21/2020 12/20/19   Arthor CaptainHarris, Abigail, PA-C  famotidine (PEPCID) 20 MG tablet Take 1 tablet (20 mg total) by mouth 2 (two) times daily. Patient not taking: Reported on 04/21/2020 12/20/19   Arthor CaptainHarris, Abigail, PA-C  gabapentin (NEURONTIN) 300 MG capsule Take 1 capsule (300 mg total) by mouth 3 (three) times daily. Patient not taking: Reported on 01/17/2018 04/20/17   Anson FretAhern, Antonia B, MD  ibuprofen (ADVIL,MOTRIN) 600 MG tablet Take 1 tablet (600 mg total) by mouth every 6 (six) hours as needed. Patient not taking: Reported on 12/20/2019  01/17/18   Elpidio AnisUpstill, Shari, PA-C  methocarbamol (ROBAXIN) 500 MG tablet Take 1 tablet (500 mg total) by mouth at bedtime as needed for muscle spasms. Patient not taking: Reported on 01/17/2018 01/12/18   Caccavale, Sophia, PA-C  neomycin-polymyxin-hydrocortisone (CORTISPORIN) 3.5-10000-1 OTIC suspension Place 4 drops into the right ear 3 (three) times daily. 04/21/20   Farrel GordonPatel, Albana Saperstein, PA-C  ondansetron (ZOFRAN) 4 MG tablet Take 1 tablet (4 mg total) by mouth every 8 (eight) hours as needed for nausea or vomiting. Patient not taking: Reported on 04/21/2020 12/20/19   Arthor CaptainHarris, Abigail, PA-C    Allergies    Patient has no known allergies.  Review of Systems   Review of Systems  Constitutional: Negative for chills, diaphoresis, fatigue and fever.  HENT: Positive for ear pain and hearing loss. Negative for congestion, dental problem, drooling, ear discharge, mouth sores, nosebleeds, postnasal drip, sinus pressure, sinus pain, sneezing, sore throat, tinnitus, trouble swallowing and voice change.   Eyes: Negative for pain and visual disturbance.  Respiratory: Positive for shortness of breath. Negative for cough, choking, chest tightness and wheezing.   Cardiovascular: Negative for chest pain, palpitations and leg swelling.  Gastrointestinal: Negative for abdominal distention, abdominal pain, diarrhea, nausea and vomiting.  Genitourinary: Negative for difficulty urinating.  Musculoskeletal: Positive for back pain. Negative for arthralgias, neck pain and neck stiffness.  Skin: Negative for pallor.  Neurological: Negative for dizziness, speech difficulty, weakness and headaches.  Psychiatric/Behavioral: Negative for confusion.    Physical Exam Updated Vital Signs BP 117/74   Pulse 87   Temp 98.7 F (37.1 C) (Oral)   Resp (!) 24   Ht 5\' 6"  (1.676 m)   Wt 98.9 kg   LMP 03/23/2020   SpO2 98%   BMI 35.19 kg/m   Physical Exam Constitutional:      General: She is not in acute distress.     Appearance: Normal appearance. She is not ill-appearing, toxic-appearing or diaphoretic.  HENT:     Right Ear: Hearing, tympanic membrane and ear canal normal.     Left Ear: Hearing, tympanic membrane, ear canal and external ear normal.     Ears:      Comments: Left ear normal.  Right ear with some crusting around earlobe, tenderness to palpation to tragus.  No erythema.  Canal clear, TMs normal.    Mouth/Throat:     Mouth: Mucous membranes are moist.     Pharynx: Oropharynx is clear.  Eyes:     General: No scleral icterus.    Extraocular Movements: Extraocular movements intact.     Pupils: Pupils are  equal, round, and reactive to light.  Cardiovascular:     Rate and Rhythm: Normal rate and regular rhythm.     Pulses: Normal pulses.     Heart sounds: Normal heart sounds.  Pulmonary:     Effort: Pulmonary effort is normal. No respiratory distress.     Breath sounds: Normal breath sounds. No stridor. No wheezing, rhonchi or rales.  Chest:     Chest wall: No tenderness.  Abdominal:     General: Abdomen is flat. There is no distension.     Palpations: Abdomen is soft.     Tenderness: There is no abdominal tenderness. There is no guarding or rebound.  Musculoskeletal:        General: No swelling or tenderness. Normal range of motion.     Cervical back: Normal range of motion and neck supple. No rigidity.       Back:     Right lower leg: No edema.     Left lower leg: No edema.     Comments: Patient with tenderness to palpation in areas depicted above near bilateral scapula, does not cross midline.  No erythema or ecchymosis noted to the area.  Normal range of motion to back.  Skin:    General: Skin is warm and dry.     Capillary Refill: Capillary refill takes less than 2 seconds.     Coloration: Skin is not pale.  Neurological:     General: No focal deficit present.     Mental Status: She is alert and oriented to person, place, and time.  Psychiatric:        Mood and Affect: Mood  normal.        Behavior: Behavior normal.     ED Results / Procedures / Treatments   Labs (all labs ordered are listed, but only abnormal results are displayed) Labs Reviewed  COMPREHENSIVE METABOLIC PANEL - Abnormal; Notable for the following components:      Result Value   Glucose, Bld 102 (*)    Alkaline Phosphatase 36 (*)    All other components within normal limits  CBC WITH DIFFERENTIAL/PLATELET    EKG EKG Interpretation  Date/Time:  Monday April 21 2020 10:03:33 EST Ventricular Rate:  74 PR Interval:    QRS Duration: 101 QT Interval:  372 QTC Calculation: 413 R Axis:   70 Text Interpretation: Sinus rhythm RSR' in V1 or V2, right VCD or RVH ST elev, probable normal early repol pattern Confirmed by Lorre Nick ([id number]) on 04/21/2020 10:17:27 AM   Radiology DG Chest Portable 1 View  Result Date: 04/21/2020 CLINICAL DATA:  Shortness of breath.  Chest pain. EXAM: PORTABLE CHEST 1 VIEW COMPARISON:  01/12/2018. FINDINGS: Cardiac silhouette is accentuated by AP portable technique and low lung volumes. Both lungs are clear. No visible pleural effusions or pneumothorax. The visualized skeletal structures are unremarkable. IMPRESSION: No acute cardiopulmonary disease. Electronically Signed   By: Feliberto Harts MD   On: 04/21/2020 10:28    Procedures Procedures (including critical care time)  Medications Ordered in ED Medications - No data to display  ED Course  I have reviewed the triage vital signs and the nursing notes.  Pertinent labs & imaging results that were available during my care of the patient were reviewed by me and considered in my medical decision making (see chart for details).    MDM Rules/Calculators/A&P  Noriah Osgood is a 24 y.o. female with no pertinent past medical history that presents the emergemcy department today for otalgia and shortness of breath/back pain.   In regards to otalgia, I think that patient most  likely has outer ear infection due to symptomology and PE.  We will treat with abx drops and have patient follow-up with PCP, return precautions given.  In regards to patient's shortness of breath/back pain, do not think that patient has any acute cardiopulmonary disease.  Patient denies any chest pain or URI symptoms, has been vaccinated against Covid.  Do not think we need to get troponins at this time, patient is a healthy 24 year old female with no cardiac disease or chest pain.  Patient is PERC negative.  Patient CBC and CMP without any acute abnormality, chest x-ray interpreted with no acute cardiopulmonary disease, EKG interpreted by me without any signs of ischemia or arrhythmia.  I think that patient's shortness of breath/back pain is due to muscle strain due to constant bending from patient's job, did discuss this with patient in depth. Symptomatic treatment discussed, patient to be discharged with strict follow-up.  When explaining the diagnosis to patient in regards to muscle strain patient agrees, states that she has been working more than normal for the past weekend. Did discuss to not lift more than 5 pounds over the next 2 weeks, patient agreeable. Strict return precautions given.  Doubt need for further emergent work up at this time. I explained the diagnosis and have given explicit precautions to return to the ER including for any other new or worsening symptoms. The patient understands and accepts the medical plan as it's been dictated and I have answered their questions. Discharge instructions concerning home care and prescriptions have been given. The patient is STABLE and is discharged to home in good condition.  I discussed this case with my attending physician who cosigned this note including patient's presenting symptoms, physical exam, and planned diagnostics and interventions. Attending physician stated agreement with plan or made changes to plan which were implemented.   Final  Clinical Impression(s) / ED Diagnoses Final diagnoses:  Acute otitis externa of right ear, unspecified type  Muscle strain    Rx / DC Orders ED Discharge Orders         Ordered    neomycin-polymyxin-hydrocortisone (CORTISPORIN) 3.5-10000-1 OTIC suspension  3 times daily        04/21/20 1145           Farrel Gordon, PA-C 04/21/20 1224    Lorre Nick, MD 04/22/20 [phone]

## 2020-04-21 NOTE — ED Triage Notes (Signed)
Pt reports R ear pain since last night, states she started having itching to her ear about 1 month ago, now painful and feels like she cannot hear out of it. Denies any other symptoms.

## 2020-04-21 NOTE — Discharge Instructions (Signed)
Lo atendern hoy por una infeccin de odo, use los antibiticos segn las indicaciones. Est atento a Herbalist seguimiento con su mdico de atencin primaria en los prximos das; si no tiene uno, puede acudir a [employer] [facility]. Tambin ha visto hoy la distensin muscular, lo ms probable es que esto se deba a su trabajo, como comentamos. Puede tomar Tylenol o ibuprofeno y usar bolsas de hielo en la espalda en las reas, su evaluacin de hoy fue reconfortante. Tambin haga un seguimiento con su mdico de atencin primaria sobre esto en los [address] Trate de abstenerse de Lexicographer objetos pesados durante los prximos [address] y solo levante menos de 5 libras durante los prximos [address]. Regrese al departamento de emergencias por cualquier nuevo empeoramiento de los sntomas.  You are seen today for ear infection, please use the antibiotics as directed.   follow-up with your primary care doctor in the next couple of days, if you do not have one you can go to [facility].  You also seen today for muscle strain, this is most likely due to your job as we discussed.  You can take Tylenol or ibuprofen use ice packs on your back in the areas, your work-up today was reassuring.  Please also follow-up with your primary care doctor about this in the next couple of days.  Try to refrain from heavy lifting over the next 10 days and only lift less than 5 pounds for the next 10 days.  Please come back to the emergency department for any new worsening concerning symptoms.

## 2020-07-02 ENCOUNTER — Encounter (HOSPITAL_COMMUNITY): Payer: Self-pay | Admitting: *Deleted

## 2020-07-02 ENCOUNTER — Ambulatory Visit (HOSPITAL_COMMUNITY)
Admission: EM | Admit: 2020-07-02 | Discharge: 2020-07-02 | Disposition: A | Discharge status: Home / Self Care | Payer: Medicaid Other | Attending: Emergency Medicine | Admitting: Emergency Medicine

## 2020-07-02 ENCOUNTER — Other Ambulatory Visit: Payer: Self-pay

## 2020-07-02 DIAGNOSIS — R21 Rash and other nonspecific skin eruption: Secondary | ICD-10-CM

## 2020-07-02 MED ORDER — METHYLPREDNISOLONE SODIUM SUCC 125 MG IJ SOLR
80.0000 mg | Freq: Once | INTRAMUSCULAR | Status: AC
  Administered 2020-07-02: 80 mg via INTRAMUSCULAR

## 2020-07-02 MED ORDER — PREDNISONE 10 MG (21) PO TBPK
ORAL_TABLET | Freq: Every day | ORAL | 0 refills | Status: DC
Start: 2020-07-03 — End: 2021-06-27

## 2020-07-02 MED ORDER — METHYLPREDNISOLONE SODIUM SUCC 125 MG IJ SOLR
INTRAMUSCULAR | Status: AC
  Filled 2020-07-02: qty 2

## 2020-07-02 NOTE — ED Provider Notes (Signed)
[facility]    CSN: [id number] Arrival date & time: 07/02/20  1712      History   Chief Complaint Chief Complaint  Patient presents with  . Rash    HPI Katherine Hess is a 25 y.o. female.   Patient presents with 1 month history of pruritic rash.  She states it started on her chest and has now spread to her face, trunk, and extremities.  She denies new products, medications, foods.  No treatments attempted at home.  No family members with rash at home.  Denies fever, chills, sore throat, cough, shortness of breath, vomiting, diarrhea, or other symptoms.  Her medical history includes chronic headache.  The history is provided by the patient and medical records.    Past Medical History:  Diagnosis Date  . Headache     Patient Active Problem List   Diagnosis Date Noted  . Normal labor 04/03/2017  . SVD (spontaneous vaginal delivery) 04/03/2017  . Chronic headache 01/29/2017    Past Surgical History:  Procedure Laterality Date  . NO PAST SURGERIES      OB History    Gravida  2   Para  2   Term  2   Preterm      AB      Living  2     SAB      IAB      Ectopic      Multiple  0   Live Births  2            Home Medications    Prior to Admission medications   Medication Sig Start Date End Date Taking? Authorizing Provider  acetaminophen (TYLENOL) 500 MG tablet Take 500 mg by mouth every 6 (six) hours as needed for headache.   Yes [provider]  predniSONE (STERAPRED UNI-PAK 21 TAB) 10 MG (21) TBPK tablet Take by mouth daily. As directed 07/03/20  Yes Mickie Bail, NP  cyclobenzaprine (FLEXERIL) 10 MG tablet Take 1 tablet (10 mg total) by mouth 2 (two) times daily as needed for muscle spasms. Patient not taking: No sig reported 01/17/18   Elpidio Anis, PA-C  dicyclomine (BENTYL) 20 MG tablet Take 1 tablet (20 mg total) by mouth 2 (two) times daily. Patient not taking: No sig reported 12/20/19   Arthor Captain, PA-C  famotidine  (PEPCID) 20 MG tablet Take 1 tablet (20 mg total) by mouth 2 (two) times daily. Patient not taking: No sig reported 12/20/19   Arthor Captain, PA-C  gabapentin (NEURONTIN) 300 MG capsule Take 1 capsule (300 mg total) by mouth 3 (three) times daily. Patient not taking: No sig reported 04/20/17   Anson Fret, MD  ibuprofen (ADVIL,MOTRIN) 600 MG tablet Take 1 tablet (600 mg total) by mouth every 6 (six) hours as needed. Patient not taking: No sig reported 01/17/18   Elpidio Anis, PA-C  methocarbamol (ROBAXIN) 500 MG tablet Take 1 tablet (500 mg total) by mouth at bedtime as needed for muscle spasms. Patient not taking: Reported on 01/17/2018 01/12/18   Caccavale, Sophia, PA-C  neomycin-polymyxin-hydrocortisone (CORTISPORIN) 3.5-10000-1 OTIC suspension Place 4 drops into the right ear 3 (three) times daily. 04/21/20   Farrel Gordon, PA-C  ondansetron (ZOFRAN) 4 MG tablet Take 1 tablet (4 mg total) by mouth every 8 (eight) hours as needed for nausea or vomiting. Patient not taking: No sig reported 12/20/19   Arthor Captain, PA-C    Family History Family History  Problem Relation Age of Onset  .  Hypertension Mother   . Diabetes Father   . Hypertension Sister   . Headache Neg Hx     Social History Social History   Tobacco Use  . Smoking status: Never Smoker  . Smokeless tobacco: Never Used  Substance Use Topics  . Alcohol use: No  . Drug use: No     Allergies   Patient has no known allergies.   Review of Systems Review of Systems  Constitutional: Negative for chills and fever.  HENT: Negative for ear pain and sore throat.   Eyes: Negative for pain and visual disturbance.  Respiratory: Negative for cough and shortness of breath.   Cardiovascular: Negative for chest pain and palpitations.  Gastrointestinal: Negative for abdominal pain, diarrhea and vomiting.  Genitourinary: Negative for dysuria and hematuria.  Musculoskeletal: Negative for arthralgias and back pain.  Skin:  Positive for rash. Negative for color change.  Neurological: Negative for seizures and syncope.  All other systems reviewed and are negative.    Physical Exam Triage Vital Signs ED Triage Vitals  Enc Vitals Group     BP 07/02/20 1842 113/76     Pulse Rate 07/02/20 1842 88     Resp 07/02/20 1842 16     Temp 07/02/20 1842 98.7 F (37.1 C)     Temp Source 07/02/20 1842 Oral     SpO2 07/02/20 1842 98 %     Weight --      Height --      Head Circumference --      Peak Flow --      Pain Score 07/02/20 1838 0     Pain Loc --      Pain Edu? --      Excl. in GC? --    No data found.  Updated Vital Signs BP 113/76 (BP Location: Left Arm)   Pulse 88   Temp 98.7 F (37.1 C) (Oral)   Resp 16   LMP 05/05/2020   SpO2 98%   Visual Acuity Right Eye Distance:   Left Eye Distance:   Bilateral Distance:    Right Eye Near:   Left Eye Near:    Bilateral Near:     Physical Exam Vitals and nursing note reviewed.  Constitutional:      General: She is not in acute distress.    Appearance: She is well-developed and well-nourished.  HENT:     Head: Normocephalic and atraumatic.     Mouth/Throat:     Mouth: Mucous membranes are moist.  Eyes:     Conjunctiva/sclera: Conjunctivae normal.  Cardiovascular:     Rate and Rhythm: Normal rate and regular rhythm.     Heart sounds: Normal heart sounds.  Pulmonary:     Effort: Pulmonary effort is normal. No respiratory distress.     Breath sounds: Normal breath sounds.  Abdominal:     Palpations: Abdomen is soft.     Tenderness: There is no abdominal tenderness.  Musculoskeletal:        General: No edema.     Cervical back: Neck supple.  Skin:    General: Skin is warm and dry.     Findings: Rash present.     Comments: Maculopapular rash on face, trunk, extremities.  Neurological:     General: No focal deficit present.     Mental Status: She is alert and oriented to person, place, and time.     Gait: Gait normal.  Psychiatric:         Mood and  Affect: Mood and affect and mood normal.        Behavior: Behavior normal.      UC Treatments / Results  Labs (all labs ordered are listed, but only abnormal results are displayed) Labs Reviewed - No data to display  EKG   Radiology No results found.  Procedures Procedures (including critical care time)  Medications Ordered in UC Medications  methylPREDNISolone sodium succinate (SOLU-MEDROL) 125 mg/2 mL injection 80 mg (has no administration in time range)    Initial Impression / Assessment and Plan / UC Course  I have reviewed the triage vital signs and the nursing notes.  Pertinent labs & imaging results that were available during my care of the patient were reviewed by me and considered in my medical decision making (see chart for details).   Rash.  Solu-Medrol given here.  Start prednisone taper tomorrow.  Additionally treating with Benadryl.  Precautions for drowsiness with Benadryl discussed.  Instructed patient to go to the ED if she has difficulty swallowing or breathing.  Discussed that she should follow-up with her PCP if her symptoms are not improving.  She agrees to plan of care.   Final Clinical Impressions(s) / UC Diagnoses   Final diagnoses:  Rash     Discharge Instructions     You were given an injection of a steroid called Solu-Medrol.  Start the prednisone taper tomorrow as directed.    Take Benadryl every 6 hours as directed; do not drive, operate machinery, or drink alcohol with this medication as it may cause drowsiness.  If you need to be awake and alert, take Claritin as directed.    Go to the emergency department if you have difficulty swallowing or breathing.           ED Prescriptions    Medication Sig Dispense Auth. Provider   predniSONE (STERAPRED UNI-PAK 21 TAB) 10 MG (21) TBPK tablet Take by mouth daily. As directed 21 tablet Mickie Bail, NP     PDMP not reviewed this encounter.   Mickie Bail, NP 07/02/20  1901

## 2020-07-02 NOTE — ED Triage Notes (Signed)
Pt reports rash first started on Jan 1,2022. The rash has continued to spread and is located on arms,abd, face. Pt reports itching at rash sites.

## 2020-07-02 NOTE — Discharge Instructions (Signed)
You were given an injection of a steroid called Solu-Medrol.  Start the prednisone taper tomorrow as directed.    Take Benadryl every 6 hours as directed; do not drive, operate machinery, or drink alcohol with this medication as it may cause drowsiness.  If you need to be awake and alert, take Claritin as directed.    Go to the emergency department if you have difficulty swallowing or breathing.

## 2021-04-02 ENCOUNTER — Encounter (HOSPITAL_COMMUNITY): Payer: Self-pay

## 2021-04-02 ENCOUNTER — Emergency Department (HOSPITAL_COMMUNITY)
Admission: EM | Admit: 2021-04-02 | Discharge: 2021-04-03 | Disposition: A | Discharge status: Home / Self Care | Payer: Medicaid Other | Attending: Emergency Medicine | Admitting: Emergency Medicine

## 2021-04-02 ENCOUNTER — Other Ambulatory Visit: Payer: Self-pay

## 2021-04-02 DIAGNOSIS — Z20822 Contact with and (suspected) exposure to covid-19: Secondary | ICD-10-CM | POA: Diagnosis not present

## 2021-04-02 DIAGNOSIS — R42 Dizziness and giddiness: Secondary | ICD-10-CM | POA: Insufficient documentation

## 2021-04-02 LAB — URINALYSIS, ROUTINE W REFLEX MICROSCOPIC
Bilirubin Urine: NEGATIVE
Glucose, UA: NEGATIVE mg/dL
Hgb urine dipstick: NEGATIVE
Ketones, ur: NEGATIVE mg/dL
Leukocytes,Ua: NEGATIVE
Nitrite: NEGATIVE
Protein, ur: NEGATIVE mg/dL
Specific Gravity, Urine: 1.024 (ref 1.005–1.030)
pH: 6 (ref 5.0–8.0)

## 2021-04-02 LAB — BASIC METABOLIC PANEL
Anion gap: 7 (ref 5–15)
BUN: 12 mg/dL (ref 6–20)
CO2: 26 mmol/L (ref 22–32)
Calcium: 8.8 mg/dL — ABNORMAL LOW (ref 8.9–10.3)
Chloride: 105 mmol/L (ref 98–111)
Creatinine, Ser: 0.81 mg/dL (ref 0.44–1.00)
GFR, Estimated: >60 mL/min (ref >60)
Glucose, Bld: 91 mg/dL (ref 70–99)
Potassium: 3.8 mmol/L (ref 3.5–5.1)
Sodium: 138 mmol/L (ref 135–145)

## 2021-04-02 LAB — CBC
HCT: 41.2 % (ref 36.0–46.0)
Hemoglobin: 13.9 g/dL (ref 12.0–15.0)
MCH: 31.2 pg (ref 26.0–34.0)
MCHC: 33.7 g/dL (ref 30.0–36.0)
MCV: 92.6 fL (ref 80.0–100.0)
Platelets: 272 10*3/uL (ref 150–400)
RBC: 4.45 MIL/uL (ref 3.87–5.11)
RDW: 12.2 % (ref 11.5–15.5)
WBC: 10.5 10*3/uL (ref 4.0–10.5)
nRBC: 0 % (ref 0.0–0.2)

## 2021-04-02 LAB — I-STAT BETA HCG BLOOD, ED (MC, WL, AP ONLY): I-stat hCG, quantitative: <5 m[IU]/mL (ref <5)

## 2021-04-02 LAB — RESP PANEL BY RT-PCR (FLU A&B, COVID) ARPGX2
Influenza A by PCR: NEGATIVE
Influenza B by PCR: NEGATIVE
SARS Coronavirus 2 by RT PCR: NEGATIVE

## 2021-04-02 LAB — CBG MONITORING, ED: Glucose-Capillary: 97 mg/dL (ref 70–99)

## 2021-04-02 NOTE — ED Provider Notes (Signed)
Emergency Medicine Provider Triage Evaluation Note  Katherine Hess , a 25 y.o. female  was evaluated in triage.  Pt complains of dizziness and headache starting this morning.  Patient states that her symptoms are made worse with standing and ambulating.  She is also felt nauseous and not interested in food, but keeping down fluids.  She has not tried taking anything for her symptoms.  No recent sick contacts.  She states that she has had similar symptoms in the past, and at that time was found to have a low potassium.  Review of Systems  Positive: Headache, dizziness, nausea, congestion Negative: Cough, sore throat, fevers, abdominal pain, vomiting, syncope  Physical Exam  BP 124/80   Pulse 86   Temp 98.4 F (36.9 C) (Oral)   Resp 16   Ht 5\' 6"  (1.676 m)   Wt 104.3 kg   LMP 03/02/2021 (Approximate)   SpO2 99%   BMI 37.12 kg/m  Gen:   Awake, no distress   Resp:  Normal effort  MSK:   Moves extremities without difficulty  Other:    Medical Decision Making  Medically screening exam initiated at 7:29 PM.  Appropriate orders placed.  Randee Huston was informed that the remainder of the evaluation will be completed by another provider, this initial triage assessment does not replace that evaluation, and the importance of remaining in the ED until their evaluation is complete.     Montez Morita 04/02/21 1930    13/03/22, MD 04/02/21 2041

## 2021-04-02 NOTE — ED Triage Notes (Signed)
Pt reports dizziness and headache that started this morning after midnight associated with nausea and SOB when ambulating.

## 2021-04-03 NOTE — ED Provider Notes (Addendum)
[facility] EMERGENCY DEPARTMENT Provider Note   CSN: [id number] Arrival date & time: 04/02/21  1655     History Chief Complaint  Patient presents with   Dizziness    Katherine Hess is a 25 y.o. female.  25 yo F with a cc dizziness.  She states it feels like she is a bit lightheaded.  Occurred a couple times when she was at work.  Usually resolves with rest and drinking fluids.  No chest pain or pressure no abdominal pain no nausea vomiting or diarrhea denies decreased oral intake.  Not worse with head movement.  No cough congestion no ear pain.  Has had itchy ears for some time.  Denies neck pain.  The history is provided by the patient.  Dizziness Quality:  Lightheadedness Severity:  Moderate Onset quality:  Gradual Duration:  2 days Timing:  Constant Progression:  Worsening Chronicity:  New Relieved by:  Nothing Worsened by:  Nothing Ineffective treatments:  None tried Associated symptoms: no chest pain, no headaches, no nausea, no palpitations, no shortness of breath and no vomiting       Past Medical History:  Diagnosis Date   Headache     Patient Active Problem List   Diagnosis Date Noted   Normal labor 04/03/2017   SVD (spontaneous vaginal delivery) 04/03/2017   Chronic headache 01/29/2017    Past Surgical History:  Procedure Laterality Date   NO PAST SURGERIES       OB History     Gravida  2   Para  2   Term  2   Preterm      AB      Living  2      SAB      IAB      Ectopic      Multiple  0   Live Births  2           Family History  Problem Relation Age of Onset   Hypertension Mother    Diabetes Father    Hypertension Sister    Headache Neg Hx     Social History   Tobacco Use   Smoking status: Never   Smokeless tobacco: Never  Substance Use Topics   Alcohol use: No   Drug use: No    Home Medications Prior to Admission medications   Medication Sig Start Date End Date Taking? Authorizing  Provider  acetaminophen (TYLENOL) 500 MG tablet Take 500 mg by mouth every 6 (six) hours as needed for headache.    [provider]  cyclobenzaprine (FLEXERIL) 10 MG tablet Take 1 tablet (10 mg total) by mouth 2 (two) times daily as needed for muscle spasms. Patient not taking: No sig reported 01/17/18   Elpidio Anis, PA-C  dicyclomine (BENTYL) 20 MG tablet Take 1 tablet (20 mg total) by mouth 2 (two) times daily. Patient not taking: No sig reported 12/20/19   Arthor Captain, PA-C  famotidine (PEPCID) 20 MG tablet Take 1 tablet (20 mg total) by mouth 2 (two) times daily. Patient not taking: No sig reported 12/20/19   Arthor Captain, PA-C  gabapentin (NEURONTIN) 300 MG capsule Take 1 capsule (300 mg total) by mouth 3 (three) times daily. Patient not taking: No sig reported 04/20/17   Anson Fret, MD  ibuprofen (ADVIL,MOTRIN) 600 MG tablet Take 1 tablet (600 mg total) by mouth every 6 (six) hours as needed. Patient not taking: No sig reported 01/17/18   Elpidio Anis, PA-C  methocarbamol (ROBAXIN)  500 MG tablet Take 1 tablet (500 mg total) by mouth at bedtime as needed for muscle spasms. Patient not taking: Reported on 01/17/2018 01/12/18   Caccavale, Sophia, PA-C  neomycin-polymyxin-hydrocortisone (CORTISPORIN) 3.5-10000-1 OTIC suspension Place 4 drops into the right ear 3 (three) times daily. 04/21/20   Alfredia Client, PA-C  ondansetron (ZOFRAN) 4 MG tablet Take 1 tablet (4 mg total) by mouth every 8 (eight) hours as needed for nausea or vomiting. Patient not taking: No sig reported 12/20/19   Margarita Mail, PA-C  predniSONE (STERAPRED UNI-PAK 21 TAB) 10 MG (21) TBPK tablet Take by mouth daily. As directed 07/03/20   Sharion Balloon, NP    Allergies    Patient has no known allergies.  Review of Systems   Review of Systems  Constitutional:  Negative for chills and fever.  HENT:  Negative for congestion and rhinorrhea.   Eyes:  Negative for redness and visual disturbance.   Respiratory:  Negative for shortness of breath and wheezing.   Cardiovascular:  Negative for chest pain and palpitations.  Gastrointestinal:  Negative for nausea and vomiting.  Genitourinary:  Negative for dysuria and urgency.  Musculoskeletal:  Negative for arthralgias and myalgias.  Skin:  Negative for pallor and wound.  Neurological:  Positive for dizziness. Negative for headaches.   Physical Exam Updated Vital Signs BP 109/71   Pulse 75   Temp 98.4 F (36.9 C) (Oral)   Resp 11   Ht 5\' 6"  (1.676 m)   Wt 104.3 kg   LMP 03/02/2021 (Approximate)   SpO2 100%   BMI 37.12 kg/m   Physical Exam Vitals and nursing note reviewed.  Constitutional:      General: She is not in acute distress.    Appearance: She is well-developed. She is not diaphoretic.  HENT:     Head: Normocephalic and atraumatic.  Eyes:     Pupils: Pupils are equal, round, and reactive to light.  Cardiovascular:     Rate and Rhythm: Normal rate and regular rhythm.     Heart sounds: No murmur heard.   No friction rub. No gallop.  Pulmonary:     Effort: Pulmonary effort is normal.     Breath sounds: No wheezing or rales.  Abdominal:     General: There is no distension.     Palpations: Abdomen is soft.     Tenderness: There is no abdominal tenderness.  Musculoskeletal:        General: No tenderness.     Cervical back: Normal range of motion and neck supple.  Skin:    General: Skin is warm and dry.  Neurological:     Mental Status: She is alert and oriented to person, place, and time.     GCS: GCS eye subscore is 4. GCS verbal subscore is 5. GCS motor subscore is 6.     Cranial Nerves: Cranial nerves 2-12 are intact.     Sensory: Sensation is intact.     Motor: Motor function is intact.     Coordination: Coordination is intact.  Psychiatric:        Behavior: Behavior normal.    ED Results / Procedures / Treatments   Labs (all labs ordered are listed, but only abnormal results are displayed) Labs  Reviewed  BASIC METABOLIC PANEL - Abnormal; Notable for the following components:      Result Value   Calcium 8.8 (*)    All other components within normal limits  RESP PANEL BY RT-PCR (FLU A&B, COVID)  ARPGX2  CBC  URINALYSIS, ROUTINE W REFLEX MICROSCOPIC  CBG MONITORING, ED  I-STAT BETA HCG BLOOD, ED ([facility], [facility], [facility] ONLY)    EKG EKG Interpretation  Date/Time:  Thursday April 02 2021 19:36:10 EDT Ventricular Rate:  82 PR Interval:  138 QRS Duration: 86 QT Interval:  360 QTC Calculation: 420 R Axis:   64 Text Interpretation: Normal sinus rhythm Normal ECG No significant change since last tracing Confirmed by Deno Etienne [phone]) on 04/03/2021 12:18:38 AM  Radiology No results found.  Procedures Procedures   Medications Ordered in ED Medications - No data to display  ED Course  I have reviewed the triage vital signs and the nursing notes.  Pertinent labs & imaging results that were available during my care of the patient were reviewed by me and considered in my medical decision making (see chart for details).    MDM Rules/Calculators/A&P                           25 yo F with a chief complaint of lightheadedness that is occurred sometimes when she is at work and she is also felt mildly fatigued.  Going on for a couple days.  She is well-appearing and nontoxic.  Benign neurologic exam.  I doubt that she has had a stroke.  She could have a new onset of a viral syndrome making her fatigue to her perhaps some mild dehydration.  We will have her increase her fluid intake at home.  PCP follow-up.  The patients results and plan were reviewed and discussed.   Any x-rays performed were independently reviewed by myself.   Differential diagnosis were considered with the presenting HPI.  Medications - No data to display  Vitals:   04/02/21 1825 04/02/21 1922 04/02/21 2230 04/03/21 0036  BP: 124/80  111/69 109/71  Pulse: 86  79 75  Resp: 16  20 11   Temp: 98.4 F (36.9 C)  98.4  F (36.9 C)   TempSrc: Oral  Oral   SpO2: 99%  100% 100%  Weight:  104.3 kg    Height:  5\' 6"  (1.676 m)      Final diagnoses:  Dizziness    Admission/ observation were discussed with the admitting physician, patient and/or family and they are comfortable with the plan.   Final Clinical Impression(s) / ED Diagnoses Final diagnoses:  Dizziness    Rx / DC Orders ED Discharge Orders     None        Deno Etienne, DO 04/03/21 [place], [place], DO 04/03/21 0118

## 2021-04-03 NOTE — Discharge Instructions (Signed)
Based on your history I think most likely you are dehydrated.  Please try and increase your fluid intake at home.  Please follow-up with your family doctor.  Return for worsening dizziness inability to walk one-sided numbness or weakness difficulty with speech or swallowing.

## 2021-06-27 ENCOUNTER — Other Ambulatory Visit: Payer: Self-pay

## 2021-06-27 ENCOUNTER — Encounter (HOSPITAL_COMMUNITY): Payer: Self-pay | Admitting: *Deleted

## 2021-06-27 ENCOUNTER — Emergency Department (HOSPITAL_COMMUNITY)
Admission: EM | Admit: 2021-06-27 | Discharge: 2021-06-27 | Disposition: A | Discharge status: Home / Self Care | Payer: Medicaid Other | Attending: Emergency Medicine | Admitting: Emergency Medicine

## 2021-06-27 DIAGNOSIS — R000 Tachycardia, unspecified: Secondary | ICD-10-CM | POA: Insufficient documentation

## 2021-06-27 DIAGNOSIS — J029 Acute pharyngitis, unspecified: Secondary | ICD-10-CM | POA: Diagnosis present

## 2021-06-27 DIAGNOSIS — J039 Acute tonsillitis, unspecified: Secondary | ICD-10-CM | POA: Diagnosis not present

## 2021-06-27 LAB — GROUP A STREP BY PCR: Group A Strep by PCR: NOT DETECTED

## 2021-06-27 MED ORDER — AMOXICILLIN 500 MG PO CAPS: 500.0000 mg | ORAL_CAPSULE | Freq: Three times a day (TID) | ORAL | 0 refills | Status: AC

## 2021-06-27 NOTE — Discharge Instructions (Signed)
Take amoxicillin as prescribed and complete the full course.  Continue with Motrin and Tylenol as needed as directed for pain.  Recheck with your doctor if symptoms do not improve.  Return to emergency room for worsening or concerning symptoms.

## 2021-06-27 NOTE — ED Triage Notes (Signed)
Patient presents to ed c/o left sided throat pain onset Thurs, states today her entire throat hurts and painful to swallow.

## 2021-06-27 NOTE — ED Provider Notes (Signed)
[facility] EMERGENCY DEPARTMENT Provider Note   CSN: [id number] Arrival date & time: 06/27/21  0947     History  Chief Complaint  Patient presents with   Sore Throat    Nikesha Kwasny is a 26 y.o. female.  26 year old female with complaint of sore throat x2 days.  Worse on the left side with painful cervical of adenopathy.  Also reports mild headache.  Denies cough, congestion, body aches, sick contacts.  Denies possibility of pregnancy.  No other complaints or concerns today.  Last took ibuprofen last night for pain.      Home Medications Prior to Admission medications   Medication Sig Start Date End Date Taking? Authorizing Provider  amoxicillin (AMOXIL) 500 MG capsule Take 1 capsule (500 mg total) by mouth 3 (three) times daily for 7 days. 06/27/21 07/04/21 Yes Jeannie Fend, PA-C  acetaminophen (TYLENOL) 500 MG tablet Take 500 mg by mouth every 6 (six) hours as needed for headache.    [provider]      Allergies    Patient has no known allergies.    Review of Systems   Review of Systems  Constitutional:  Negative for fever.  HENT:  Positive for sore throat. Negative for congestion.   Respiratory:  Negative for cough.   Musculoskeletal:  Negative for arthralgias and myalgias.  Skin:  Negative for rash.  Allergic/Immunologic: Negative for immunocompromised state.  Neurological:  Positive for headaches.  Hematological:  Positive for adenopathy.   Physical Exam Updated Vital Signs BP 136/73    Pulse (!) 114    Temp 99.2 F (37.3 C)    Resp 18    Ht 5\' 6"  (1.676 m)    Wt 104.3 kg    SpO2 96%    BMI 37.12 kg/m  Physical Exam Vitals and nursing note reviewed.  Constitutional:      General: She is not in acute distress.    Appearance: She is well-developed. She is not diaphoretic.  HENT:     Head: Normocephalic and atraumatic.     Jaw: No trismus.     Right Ear: Tympanic membrane and ear canal normal.     Left Ear: Tympanic membrane  and ear canal normal.     Mouth/Throat:     Mouth: Mucous membranes are moist.     Pharynx: No oropharyngeal exudate, posterior oropharyngeal erythema or uvula swelling.     Tonsils: 2+ on the right. 2+ on the left.  Eyes:     Conjunctiva/sclera: Conjunctivae normal.  Cardiovascular:     Rate and Rhythm: Regular rhythm. Tachycardia present.     Heart sounds: Normal heart sounds.  Pulmonary:     Effort: Pulmonary effort is normal.     Breath sounds: Normal breath sounds.  Musculoskeletal:     Cervical back: Neck supple.  Lymphadenopathy:     Cervical: Cervical adenopathy present.     Right cervical: Superficial cervical adenopathy present.     Left cervical: Superficial cervical adenopathy present.  Skin:    General: Skin is warm and dry.     Findings: No erythema or rash.  Neurological:     Mental Status: She is alert and oriented to person, place, and time.  Psychiatric:        Behavior: Behavior normal.    ED Results / Procedures / Treatments   Labs (all labs ordered are listed, but only abnormal results are displayed) Labs Reviewed  GROUP A STREP BY PCR    EKG  None  Radiology No results found.  Procedures Procedures    Medications Ordered in ED Medications - No data to display  ED Course/ Medical Decision Making/ A&P                           Medical Decision Making  26 year old female with sore throat x2 days.  Found to have enlarged tonsils without exudate with tender cervical of adenopathy.  Reports associated headache.  Denies other URI symptoms including cough, congestion.  Rapid strep is negative.  We will treat for tonsillitis with amoxicillin.  Advised to recheck with PCP if symptoms do not improve, return to ED for worsening or concerning symptoms.        Final Clinical Impression(s) / ED Diagnoses Final diagnoses:  Tonsillitis    Rx / DC Orders ED Discharge Orders          Ordered    amoxicillin (AMOXIL) 500 MG capsule  3 times daily         06/27/21 1052              Alden Hipp 06/27/21 1053    [place], Adam, DO 06/27/21 1119

## 2022-08-30 ENCOUNTER — Other Ambulatory Visit: Payer: Self-pay

## 2022-08-30 ENCOUNTER — Emergency Department (HOSPITAL_COMMUNITY): Payer: Medicaid Other

## 2022-08-30 ENCOUNTER — Emergency Department (HOSPITAL_COMMUNITY)
Admission: EM | Admit: 2022-08-30 | Discharge: 2022-08-31 | Disposition: A | Discharge status: Home / Self Care | Payer: Medicaid Other | Attending: Emergency Medicine | Admitting: Emergency Medicine

## 2022-08-30 DIAGNOSIS — J069 Acute upper respiratory infection, unspecified: Secondary | ICD-10-CM | POA: Diagnosis not present

## 2022-08-30 DIAGNOSIS — Z20822 Contact with and (suspected) exposure to covid-19: Secondary | ICD-10-CM | POA: Insufficient documentation

## 2022-08-30 DIAGNOSIS — R059 Cough, unspecified: Secondary | ICD-10-CM | POA: Diagnosis present

## 2022-08-30 LAB — RESP PANEL BY RT-PCR (RSV, FLU A&B, COVID)  RVPGX2
Influenza A by PCR: NEGATIVE
Influenza B by PCR: NEGATIVE
Resp Syncytial Virus by PCR: NEGATIVE
SARS Coronavirus 2 by RT PCR: NEGATIVE

## 2022-08-30 MED ORDER — ONDANSETRON 4 MG PO TBDP
4.0000 mg | ORAL_TABLET | Freq: Once | ORAL | Status: AC
  Administered 2022-08-30: 4 mg via ORAL
  Filled 2022-08-30: qty 1

## 2022-08-30 NOTE — ED Triage Notes (Signed)
Patient reports persistent dry cough with chest congestion and occasional rhinorrhea for 2 week . Denies fever or chills.

## 2022-08-30 NOTE — ED Provider Triage Note (Signed)
Emergency Medicine Provider Triage Evaluation Note  Katherine Hess , a 27 y.o. female  was evaluated in triage.  Pt complains of cough, with shob nausea, for >12 days. Denies fever, chest pain other than when coughing.  Review of Systems  Positive: Cough, shob Negative: Fever, chills  Physical Exam  There were no vitals taken for this visit. Gen:   Awake, no distress   Resp:  Normal effort  MSK:   Moves extremities without difficulty  Other:  Dry cough, no significant rhonchi, wheezing on exam  Medical Decision Making  Medically screening exam initiated at 8:32 PM.  Appropriate orders placed.  Sparrow Fedak was informed that the remainder of the evaluation will be completed by another provider, this initial triage assessment does not replace that evaluation, and the importance of remaining in the ED until their evaluation is complete.  Workup initiated in triage    Dorien Chihuahua 08/30/22 2033

## 2022-08-31 MED ORDER — CETIRIZINE HCL 10 MG PO TABS: 10.0000 mg | ORAL_TABLET | Freq: Every day | ORAL | 0 refills | Status: DC

## 2022-08-31 MED ORDER — PREDNISONE 20 MG PO TABS: 40.0000 mg | ORAL_TABLET | Freq: Every day | ORAL | 0 refills | Status: DC

## 2022-08-31 MED ORDER — PREDNISONE 20 MG PO TABS
60.0000 mg | ORAL_TABLET | Freq: Once | ORAL | Status: AC
  Administered 2022-08-31: 60 mg via ORAL
  Filled 2022-08-31: qty 3

## 2022-08-31 NOTE — Discharge Instructions (Addendum)
You are seen today for cough.  Your workup is reassuring.  This could be related to a viral infection or allergies.  Your COVID and influenza testing are negative and your x-ray does not show any pneumonia.  Take over-the-counter medications for cough.  You can start an allergy medicine to see if this helps.  Cough may linger for 2 to 4 weeks.

## 2022-08-31 NOTE — ED Provider Notes (Signed)
[facility] Provider Note   CSN: [id number] Arrival date & time: 08/30/22  2017     History  Chief Complaint  Patient presents with   Cough    Katherine Hess is a 27 y.o. female.  HPI     This is a 27 year old female who presents with cough.  Patient reports 2-week history of persistent dry cough.  Also reports some congestion and rhinorrhea.  Has not had any fevers.  She states that the cough is keeping her awake and interfering with her work.  She is not a smoker.  No history of asthma.  No known history of seasonal allergies.  No known sick contacts.  Home Medications Prior to Admission medications   Medication Sig Start Date End Date Taking? Authorizing Provider  cetirizine (ZYRTEC ALLERGY) 10 MG tablet Take 1 tablet (10 mg total) by mouth daily. 08/31/22  Yes Andros Channing, Barbette Hair, MD  predniSONE (DELTASONE) 20 MG tablet Take 2 tablets (40 mg total) by mouth daily. 08/31/22  Yes Arturo Sofranko, Barbette Hair, MD  acetaminophen (TYLENOL) 500 MG tablet Take 500 mg by mouth every 6 (six) hours as needed for headache.    [provider]      Allergies    Patient has no known allergies.    Review of Systems   Review of Systems  Constitutional:  Negative for fever.  HENT:  Positive for congestion.   Respiratory:  Positive for cough. Negative for shortness of breath.   Cardiovascular:  Negative for chest pain.  All other systems reviewed and are negative.   Physical Exam Updated Vital Signs BP 118/80 (BP Location: Right Arm)   Pulse 84   Temp 98.1 F (36.7 C) (Oral)   Resp 16   SpO2 93%  Physical Exam Vitals and nursing note reviewed.  Constitutional:      Appearance: She is well-developed. She is obese. She is not ill-appearing.  HENT:     Head: Normocephalic and atraumatic.  Eyes:     Pupils: Pupils are equal, round, and reactive to light.  Cardiovascular:     Rate and Rhythm: Normal rate and regular rhythm.     Heart  sounds: Normal heart sounds.  Pulmonary:     Effort: Pulmonary effort is normal. No respiratory distress.     Breath sounds: Normal breath sounds. No wheezing.     Comments: Occasional cough but clear breath sounds, no wheezing Abdominal:     Palpations: Abdomen is soft.  Musculoskeletal:     Cervical back: Neck supple.  Skin:    General: Skin is warm and dry.  Neurological:     Mental Status: She is alert and oriented to person, place, and time.  Psychiatric:        Mood and Affect: Mood normal.     ED Results / Procedures / Treatments   Labs (all labs ordered are listed, but only abnormal results are displayed) Labs Reviewed  RESP PANEL BY RT-PCR (RSV, FLU A&B, COVID)  [id number]    EKG None  Radiology DG Chest 2 View  Result Date: 08/30/2022 CLINICAL DATA:  Cough and shortness of breath for 12 days EXAM: CHEST - 2 VIEW COMPARISON:  04/21/2020 FINDINGS: Frontal and lateral views of the chest demonstrate an unremarkable cardiac silhouette. No airspace disease, effusion, or pneumothorax. No acute bony abnormalities. IMPRESSION: 1. No acute intrathoracic process. Electronically Signed   By: Randa Ngo M.D.   On: 08/30/2022 21:06    Procedures  Procedures    Medications Ordered in ED Medications  ondansetron (ZOFRAN-ODT) disintegrating tablet 4 mg (4 mg Oral Given 08/30/22 2037)  predniSONE (DELTASONE) tablet 60 mg (60 mg Oral Given 08/31/22 0349)    ED Course/ Medical Decision Making/ A&P                             Medical Decision Making Risk OTC drugs. Prescription drug management.   This patient presents to the ED for concern of cough, this involves an extensive number of treatment options, and is a complaint that carries with it a high risk of complications and morbidity.  I considered the following differential and admission for this acute, potentially life threatening condition.  The differential diagnosis includes bronchitis, viral URI such as COVID or influenza,  pneumonia, allergies  MDM:    This is a 26 year old female who presents with ongoing cough.  She is nontoxic and vital signs are largely reassuring.  She is in no respiratory distress.  She has fairly clear breath sounds.  She is not an asthmatic or smoker.  Chest x-ray shows no evidence of pneumothorax or pneumonia.  COVID and influenza testing are negative.  Likely viral or allergic in nature.  Discussed with the patient that bronchitis cough can linger for up to 4 weeks.  She is given a dose of prednisone.  Will give a burst dose of steroids for any potential bronchospasm and have her start Zyrtec.  (Labs, imaging, consults)  Labs: I Ordered, and personally interpreted labs.  The pertinent results include: COVID and influenza testing  Imaging Studies ordered: I ordered imaging studies including chest x-ray I independently visualized and interpreted imaging. I agree with the radiologist interpretation  Additional history obtained from chart review.  External records from outside source obtained and reviewed including prior evaluations  Cardiac Monitoring: The patient was not maintained on a cardiac monitor.  If on the cardiac monitor, I personally viewed and interpreted the cardiac monitored which showed an underlying rhythm of: Sinus  Reevaluation: After the interventions noted above, I reevaluated the patient and found that they have :stayed the same  Social Determinants of Health:  lives independently  Disposition: Discharge  Co morbidities that complicate the patient evaluation  Past Medical History:  Diagnosis Date   Headache      Medicines Meds ordered this encounter  Medications   ondansetron (ZOFRAN-ODT) disintegrating tablet 4 mg   predniSONE (DELTASONE) tablet 60 mg   cetirizine (ZYRTEC ALLERGY) 10 MG tablet    Sig: Take 1 tablet (10 mg total) by mouth daily.    Dispense:  30 tablet    Refill:  0   predniSONE (DELTASONE) 20 MG tablet    Sig: Take 2 tablets  (40 mg total) by mouth daily.    Dispense:  8 tablet    Refill:  0    I have reviewed the patients home medicines and have made adjustments as needed  Problem List / ED Course: Problem List Items Addressed This Visit   None Visit Diagnoses     URI with cough and congestion    -  Primary                   Final Clinical Impression(s) / ED Diagnoses Final diagnoses:  URI with cough and congestion    Rx / DC Orders ED Discharge Orders          Ordered    cetirizine (  ZYRTEC ALLERGY) 10 MG tablet  Daily        08/31/22 0351    predniSONE (DELTASONE) 20 MG tablet  Daily        08/31/22 0351              Merryl Hacker, MD 08/31/22 [phone]

## 2023-02-23 ENCOUNTER — Encounter: Payer: Self-pay | Admitting: Obstetrics and Gynecology

## 2023-02-23 ENCOUNTER — Ambulatory Visit: Payer: Medicaid Other | Admitting: Obstetrics and Gynecology

## 2023-02-23 VITALS — BP 118/81 | HR 81 | Ht 66.0 in | Wt 248.4 lb

## 2023-02-23 DIAGNOSIS — Z3046 Encounter for surveillance of implantable subdermal contraceptive: Secondary | ICD-10-CM

## 2023-02-23 NOTE — Progress Notes (Signed)
     GYNECOLOGY OFFICE PROCEDURE NOTE  Katherine Hess is a 27 y.o. [id number] here for Nexplanon removal  Last pap smear was on 2023 and reports it was normal.  No other gynecologic concerns.   Nexplanon Removal Patient identified, informed consent performed, consent signed.   Appropriate time out taken.   Nexplanon site identified.  Area prepped in usual sterile fashon. One ml of 1% lidocaine was used to anesthetize the area at the distal end of the implant. A small stab incision was made right beside the implant on the distal portion.  The Nexplanon rod was grasped using hemostats and removed without difficulty.  There was minimal blood loss. There were no complications.  3 ml of 1% lidocaine was injected around the incision for post-procedure analgesia.  Steri-strips were applied over the small incision.  A pressure bandage was applied to reduce any bruising.    The patient tolerated the procedure well and was given post procedure instructions.  Patient is planning to  use condoms for contraception   Albertine Grates, FNP [facility] [employer], [facility]

## 2023-02-23 NOTE — Progress Notes (Signed)
Pt presents for Nexplanon removal  Nexplanon placed 3 years ago.  Last PAP 1 years ago

## 2023-05-11 ENCOUNTER — Emergency Department (HOSPITAL_COMMUNITY)
Admission: EM | Admit: 2023-05-11 | Discharge: 2023-05-12 | Disposition: A | Discharge status: Home / Self Care | Payer: Medicaid Other | Attending: Emergency Medicine | Admitting: Emergency Medicine

## 2023-05-11 ENCOUNTER — Encounter (HOSPITAL_COMMUNITY): Payer: Self-pay

## 2023-05-11 ENCOUNTER — Other Ambulatory Visit: Payer: Self-pay

## 2023-05-11 DIAGNOSIS — R1032 Left lower quadrant pain: Secondary | ICD-10-CM | POA: Insufficient documentation

## 2023-05-11 DIAGNOSIS — R112 Nausea with vomiting, unspecified: Secondary | ICD-10-CM | POA: Diagnosis not present

## 2023-05-11 DIAGNOSIS — R109 Unspecified abdominal pain: Secondary | ICD-10-CM | POA: Diagnosis present

## 2023-05-11 LAB — URINALYSIS, ROUTINE W REFLEX MICROSCOPIC
Bilirubin Urine: NEGATIVE
Glucose, UA: NEGATIVE mg/dL
Ketones, ur: NEGATIVE mg/dL
Leukocytes,Ua: NEGATIVE
Nitrite: NEGATIVE
Protein, ur: NEGATIVE mg/dL
Specific Gravity, Urine: 1.02 (ref 1.005–1.030)
pH: 6 (ref 5.0–8.0)

## 2023-05-11 LAB — CBC
HCT: 40.3 % (ref 36.0–46.0)
Hemoglobin: 13.7 g/dL (ref 12.0–15.0)
MCH: 31.1 pg (ref 26.0–34.0)
MCHC: 34 g/dL (ref 30.0–36.0)
MCV: 91.4 fL (ref 80.0–100.0)
Platelets: 274 10*3/uL (ref 150–400)
RBC: 4.41 MIL/uL (ref 3.87–5.11)
RDW: 11.8 % (ref 11.5–15.5)
WBC: 10.3 10*3/uL (ref 4.0–10.5)
nRBC: 0 % (ref 0.0–0.2)

## 2023-05-11 LAB — COMPREHENSIVE METABOLIC PANEL
ALT: 20 U/L (ref 0–44)
AST: 18 U/L (ref 15–41)
Albumin: 3.9 g/dL (ref 3.5–5.0)
Alkaline Phosphatase: 47 U/L (ref 38–126)
Anion gap: 9 (ref 5–15)
BUN: 9 mg/dL (ref 6–20)
CO2: 24 mmol/L (ref 22–32)
Calcium: 9 mg/dL (ref 8.9–10.3)
Chloride: 105 mmol/L (ref 98–111)
Creatinine, Ser: 0.63 mg/dL (ref 0.44–1.00)
GFR, Estimated: >60 mL/min (ref >60)
Glucose, Bld: 91 mg/dL (ref 70–99)
Potassium: 3.8 mmol/L (ref 3.5–5.1)
Sodium: 138 mmol/L (ref 135–145)
Total Bilirubin: 0.5 mg/dL (ref <1.2)
Total Protein: 7.1 g/dL (ref 6.5–8.1)

## 2023-05-11 LAB — HCG, SERUM, QUALITATIVE: Preg, Serum: NEGATIVE

## 2023-05-11 LAB — LIPASE, BLOOD: Lipase: 34 U/L (ref 11–51)

## 2023-05-11 NOTE — ED Triage Notes (Signed)
Pt c/o left side abd pain, sometimes it goes away. C/O HA and states she can not eat/ upper back pain. N/v.

## 2023-05-12 ENCOUNTER — Emergency Department (HOSPITAL_COMMUNITY): Payer: Medicaid Other

## 2023-05-12 NOTE — ED Provider Notes (Signed)
[place] EMERGENCY DEPARTMENT AT [facility] Provider Note   CSN: [id number] Arrival date & time: 05/11/23  1847     History  Chief Complaint  Patient presents with   Abdominal Pain    Katherine Hess is a 27 y.o. female.  Patient presents to the emergency department complaining of left-sided abdominal pain which been intermittent over the past week.  She describes it as a sharp pain when it occurs.  She also reports feeling the pain in her left flank.  She endorses intermittent nausea and vomiting.  Patient denies urinary symptoms, diarrhea, fever, chest pain, shortness of breath.  Past medical history significant for chronic headaches   Abdominal Pain      Home Medications Prior to Admission medications   Medication Sig Start Date End Date Taking? Authorizing Provider  acetaminophen (TYLENOL) 500 MG tablet Take 500 mg by mouth every 6 (six) hours as needed for headache. Patient not taking: Reported on 02/23/2023    [provider]  cetirizine (ZYRTEC ALLERGY) 10 MG tablet Take 1 tablet (10 mg total) by mouth daily. Patient not taking: Reported on 02/23/2023 08/31/22   Horton, Mayer Masker, MD  predniSONE (DELTASONE) 20 MG tablet Take 2 tablets (40 mg total) by mouth daily. Patient not taking: Reported on 02/23/2023 08/31/22   Horton, Mayer Masker, MD      Allergies    Patient has no known allergies.    Review of Systems   Review of Systems  Gastrointestinal:  Positive for abdominal pain.    Physical Exam Updated Vital Signs BP 122/81   Pulse 80   Temp 98.2 F (36.8 C)   Resp 16   Ht 5\' 6"  (1.676 m)   Wt 111.1 kg   SpO2 100%   BMI 39.54 kg/m  Physical Exam Vitals and nursing note reviewed.  Constitutional:      General: She is not in acute distress.    Appearance: She is well-developed.  HENT:     Head: Normocephalic and atraumatic.  Eyes:     Conjunctiva/sclera: Conjunctivae normal.  Cardiovascular:     Rate and Rhythm: Normal rate and  regular rhythm.     Heart sounds: No murmur heard. Pulmonary:     Effort: Pulmonary effort is normal. No respiratory distress.     Breath sounds: Normal breath sounds.  Abdominal:     Palpations: Abdomen is soft.     Tenderness: There is abdominal tenderness in the left lower quadrant. There is left CVA tenderness.  Musculoskeletal:        General: No swelling.     Cervical back: Neck supple.  Skin:    General: Skin is warm and dry.     Capillary Refill: Capillary refill takes less than 2 seconds.  Neurological:     Mental Status: She is alert.  Psychiatric:        Mood and Affect: Mood normal.     ED Results / Procedures / Treatments   Labs (all labs ordered are listed, but only abnormal results are displayed) Labs Reviewed  URINALYSIS, ROUTINE W REFLEX MICROSCOPIC - Abnormal; Notable for the following components:      Result Value   Hgb urine dipstick SMALL (*)    Bacteria, UA RARE (*)    All other components within normal limits  LIPASE, BLOOD  COMPREHENSIVE METABOLIC PANEL  CBC  HCG, SERUM, QUALITATIVE    EKG None  Radiology CT ABDOMEN PELVIS WO CONTRAST Result Date: 05/12/2023 CLINICAL DATA:  27 year old  female with history of left-sided abdominal and flank pain intermittently. EXAM: CT ABDOMEN AND PELVIS WITHOUT CONTRAST TECHNIQUE: Multidetector CT imaging of the abdomen and pelvis was performed following the standard protocol without IV contrast. RADIATION DOSE REDUCTION: This exam was performed according to the departmental dose-optimization program which includes automated exposure control, adjustment of the mA and/or kV according to patient size and/or use of iterative reconstruction technique. COMPARISON:  No priors. FINDINGS: Lower chest: Unremarkable. Hepatobiliary: Diffuse low attenuation throughout the hepatic parenchyma, indicative of a background of hepatic steatosis. No definite suspicious cystic or solid hepatic lesions are confidently identified on  today's noncontrast CT examination. Gallbladder is nearly completely decompressed around multiple partially calcified indwelling gallstones. No definite pericholecystic fluid or surrounding inflammatory changes are noted. Pancreas: No definite pancreatic mass or peripancreatic fluid collections or inflammatory changes are noted on today's noncontrast CT examination. Spleen: Unremarkable. Adrenals/Urinary Tract: There are no abnormal calcifications within the collecting system of either kidney, along the course of either ureter, or within the lumen of the urinary bladder. No hydroureteronephrosis or perinephric stranding to suggest urinary tract obstruction at this time. The unenhanced appearance of the kidneys is unremarkable bilaterally. Unenhanced appearance of the urinary bladder is normal. Bilateral adrenal glands are normal in appearance. Stomach/Bowel: Unenhanced appearance of the stomach is normal. No pathologic dilatation of small bowel or colon. Normal appendix. Vascular/Lymphatic: No atherosclerotic calcifications are noted in the abdominal aorta or pelvic vasculature. No lymphadenopathy noted in the abdomen or pelvis. Reproductive: Uterus and ovaries are unremarkable in appearance. Other: No significant volume of ascites.  No pneumoperitoneum. Musculoskeletal: There are no aggressive appearing lytic or blastic lesions noted in the visualized portions of the skeleton. IMPRESSION: 1. Cholelithiasis without definitive imaging findings to suggest acute cholecystitis. If there is clinical concern for potential acute cholecystitis, further evaluation with right upper quadrant abdominal ultrasound should be considered. 2. No other potential acute findings are noted elsewhere in the abdomen or pelvis to account for the patient's symptoms. Electronically Signed   By: Trudie Reed M.D.   On: 05/12/2023 06:26    Procedures Procedures    Medications Ordered in ED Medications - No data to display  ED  Course/ Medical Decision Making/ A&P                                 Medical Decision Making Amount and/or Complexity of Data Reviewed Labs: ordered. Radiology: ordered.   This patient presents to the ED for concern of abdominal pain, this involves an extensive number of treatment options, and is a complaint that carries with it a high risk of complications and morbidity.  The differential diagnosis includes nephrolithiasis, pyelonephritis, hydronephrosis, diverticulitis, appendicitis, colitis, others   Co morbidities that complicate the patient evaluation  Chronic headaches   Additional history obtained:   External records from outside source obtained and reviewed including outside imaging from August showing left ovarian cyst, 4 x 3.5 x 2.5 cm   Lab Tests:  I Ordered, and personally interpreted labs.  The pertinent results include: Negative pregnancy test, grossly unremarkable CMP, CBC, lipase, UA with small hemoglobin, rare bacteria   Imaging Studies ordered:  I ordered imaging studies including CT abdomen pelvis without contrast I independently visualized and interpreted imaging which showed  1. Cholelithiasis without definitive imaging findings to suggest  acute cholecystitis. If there is clinical concern for potential  acute cholecystitis, further evaluation with right upper quadrant  abdominal  ultrasound should be considered.  2. No other potential acute findings are noted elsewhere in the  abdomen or pelvis to account for the patient's symptoms.   I agree with the radiologist interpretation   Social Determinants of Health:  Social determinants of health were not a significant factor during this visit   Test / Admission - Considered:  Patient with CT findings concerning for cholelithiasis the patient's pain is all left-sided in nature, clinically does not correlate.  No concern at this time for cholecystitis.  No other acute findings to explain patient's pain.   Patient endorses pain feeling better at this time.  Will recommend follow-up with primary care and treatment with ibuprofen at this time.  Discharge home with return precautions.         Final Clinical Impression(s) / ED Diagnoses Final diagnoses:  Left lower quadrant abdominal pain    Rx / DC Orders ED Discharge Orders     None         Pamala Duffel 05/12/23 2841    Marily Memos, MD 05/13/23 [phone]

## 2023-05-12 NOTE — Discharge Instructions (Signed)
Your workup today was reassuring.  You do have gallstones but these are not likely causing any of your symptoms at this time.  Please follow-up with your primary care provider.  You may take ibuprofen for pain.  If you develop any life-threatening symptoms return to the emergency department.

## 2023-06-07 ENCOUNTER — Ambulatory Visit: Payer: Self-pay | Admitting: General Surgery

## 2023-06-07 ENCOUNTER — Encounter: Payer: Self-pay | Admitting: General Surgery

## 2023-06-07 DIAGNOSIS — K8020 Calculus of gallbladder without cholecystitis without obstruction: Secondary | ICD-10-CM

## 2023-06-07 NOTE — H&P (View-Only) (Signed)
 REFERRING PHYSICIAN:  Joshua Clayborne Sharps, PA PROVIDER:  RICHERD SILVERSMITH, MD MRN: [id number] DOB: 1996-03-23  HPI  Katherine Hess is an 28 y.o. female with no significant PMH who was seen in my office on 12/26 for symptomatic cholelithiasis.  Patient presented to ED on 12/12 for left sided abdominal pain that then migrated to right. She has had several episodes of this both prior to and after her presentation to ED that have all been right upper quadrant. No further episodes of left sided pain. Some nausea/emesis at times. No fevers/chills. No dysuria or changes in bowel habits. She denies any previous abdominal surgery. She says the pain is at random, she does not think there is a relation to food.  Examination and discussion conducted with Spanish interpreter--[employer] ID [id number].   10 point review of systems is negative except as listed above in HPI.  Objective  Past Medical History: Past Medical History:  Diagnosis Date   Headache     Past Surgical History: Past Surgical History:  Procedure Laterality Date   NO PAST SURGERIES      Family History:  Family History  Problem Relation Age of Onset   Hypertension Mother    Diabetes Father    Hypertension Sister    Headache Neg Hx     Social History:  reports that she has never smoked. She has never used smokeless tobacco. She reports that she does not drink alcohol and does not use drugs.  Allergies: No Known Allergies  Medications: I have reviewed the patient's current medications.  Labs: I have personally reviewed all labs for the past 24h No results found for this or any previous visit (from the past 48 hours).  Imaging: I have personally reviewed and interpreted all imaging for the past 24h and agree with the radiologist's impression.  CT 05/12/23: Diffuse low attenuation throughout the hepatic parenchyma, indicative of a background of hepatic steatosis. No definite suspicious cystic or solid hepatic  lesions are confidently identified on today's noncontrast CT examination. Gallbladder is nearly completely decompressed around multiple partially calcified indwelling gallstones. No definite pericholecystic fluid or surrounding inflammatory changes are noted.    Physical Exam General: No acute distress, well appearing HEENT: PERRL, hearing grossly normal, mucous membranes moist CV: Regular rate and rhythm Pulm: Normal work of breathing on room air Abd: Soft, minimally tender in RUQ, nondistended Extremities: Warm and well perfused Neuro: A&O x4, no focal neurologic deficits Psych: Appropriate mood and effect    Assessment   Katherine Hess is an 28 y.o. female with symptomatic cholelithiasis who presents for laparoscopic cholecystectomy  Plan  - Proceed to OR for laparoscopic cholecystectomy  We discussed the etiology of patient's pain, we discussed treatment options and recommended surgery. We discussed details of surgery including general anesthesia, laparoscopic approach, identification of cystic duct and common bile duct. Ligation of cystic duct and cystic artery. Possible need for intraoperative cholangiogram, open procedure, and subtotal cholecystectomy. Possible risks of common bile duct injury, injury to surrounding structures, bile leak, bleeding, infection, diarrhea, retained stone and hernia and possibility of this not resolving her pain. However, I do think that her symptoms are consistent with symptomatic cholelithiasis and that she will benefit from the procedure.The patient showed good understanding and all questions were answered.  I reviewed last 24 h vitals and pain scores, last 48 h intake and output, last 24 h labs and trends, and last 24 h imaging results.  This care required moderate level of medical decision  making.

## 2023-06-07 NOTE — H&P (Signed)
 REFERRING PHYSICIAN:  Joshua Clayborne Sharps, PA PROVIDER:  RICHERD SILVERSMITH, MD MRN: [id number] DOB: 1996-03-23  HPI  Katherine Hess is an 28 y.o. female with no significant PMH who was seen in my office on 12/26 for symptomatic cholelithiasis.  Patient presented to ED on 12/12 for left sided abdominal pain that then migrated to right. She has had several episodes of this both prior to and after her presentation to ED that have all been right upper quadrant. No further episodes of left sided pain. Some nausea/emesis at times. No fevers/chills. No dysuria or changes in bowel habits. She denies any previous abdominal surgery. She says the pain is at random, she does not think there is a relation to food.  Examination and discussion conducted with Spanish interpreter--[employer] ID [id number].   10 point review of systems is negative except as listed above in HPI.  Objective  Past Medical History: Past Medical History:  Diagnosis Date   Headache     Past Surgical History: Past Surgical History:  Procedure Laterality Date   NO PAST SURGERIES      Family History:  Family History  Problem Relation Age of Onset   Hypertension Mother    Diabetes Father    Hypertension Sister    Headache Neg Hx     Social History:  reports that she has never smoked. She has never used smokeless tobacco. She reports that she does not drink alcohol and does not use drugs.  Allergies: No Known Allergies  Medications: I have reviewed the patient's current medications.  Labs: I have personally reviewed all labs for the past 24h No results found for this or any previous visit (from the past 48 hours).  Imaging: I have personally reviewed and interpreted all imaging for the past 24h and agree with the radiologist's impression.  CT 05/12/23: Diffuse low attenuation throughout the hepatic parenchyma, indicative of a background of hepatic steatosis. No definite suspicious cystic or solid hepatic  lesions are confidently identified on today's noncontrast CT examination. Gallbladder is nearly completely decompressed around multiple partially calcified indwelling gallstones. No definite pericholecystic fluid or surrounding inflammatory changes are noted.    Physical Exam General: No acute distress, well appearing HEENT: PERRL, hearing grossly normal, mucous membranes moist CV: Regular rate and rhythm Pulm: Normal work of breathing on room air Abd: Soft, minimally tender in RUQ, nondistended Extremities: Warm and well perfused Neuro: A&O x4, no focal neurologic deficits Psych: Appropriate mood and effect    Assessment   Katherine Hess is an 28 y.o. female with symptomatic cholelithiasis who presents for laparoscopic cholecystectomy  Plan  - Proceed to OR for laparoscopic cholecystectomy  We discussed the etiology of patient's pain, we discussed treatment options and recommended surgery. We discussed details of surgery including general anesthesia, laparoscopic approach, identification of cystic duct and common bile duct. Ligation of cystic duct and cystic artery. Possible need for intraoperative cholangiogram, open procedure, and subtotal cholecystectomy. Possible risks of common bile duct injury, injury to surrounding structures, bile leak, bleeding, infection, diarrhea, retained stone and hernia and possibility of this not resolving her pain. However, I do think that her symptoms are consistent with symptomatic cholelithiasis and that she will benefit from the procedure.The patient showed good understanding and all questions were answered.  I reviewed last 24 h vitals and pain scores, last 48 h intake and output, last 24 h labs and trends, and last 24 h imaging results.  This care required moderate level of medical decision  making.

## 2023-06-08 ENCOUNTER — Other Ambulatory Visit: Payer: Self-pay

## 2023-06-08 ENCOUNTER — Encounter (HOSPITAL_COMMUNITY): Payer: Self-pay | Admitting: General Surgery

## 2023-06-08 NOTE — Progress Notes (Signed)
 Used [employer] ID [phone] for PAT information and instructions for DOS.  Chest x-ray - 08/30/22 EKG - 04/02/21 Stress Test - n/a ECHO - n/a Cardiac Cath - n/a  ICD Pacemaker/Loop - n/a  Sleep Study -  n/a CPAP - none  Diabetes - n/a  Blood Thinner Instructions:  n/a  Aspirin Instructions: n/a  NPO  Anesthesia review: No  STOP now taking any Aspirin (unless otherwise instructed by your surgeon), Aleve, Naproxen, Ibuprofen , Motrin , Advil , Goody's, BC's, all herbal medications, fish oil, and all vitamins.   Coronavirus Screening Do you have any of the following symptoms:  Cough yes/no: No Fever (>100.21F)  yes/no: No Runny nose yes/no: No Sore throat yes/no: No Difficulty breathing/shortness of breath  yes/no: No  Have you traveled in the last 14 days and where? yes/no: No  Patient verbalized understanding of instructions that were given via phone via Spanish Interpreter [phone].

## 2023-06-08 NOTE — Progress Notes (Signed)
 [employer] Inter[employer] ID # [id number] used for PAT information and instructions for DOS.  Interpreter [facility] for patient to let her know that we will try to reach her again on Thursday about her upcoming 06/10/23 surgery at [facility].    PCP -  Cardiologist - none  Chest x-ray - 08/30/22 EKG - 04/02/21 Stress Test - n/a ECHO - n/a Cardiac Cath - n/a  ICD Pacemaker/Loop - n/a  Sleep Study -  n/a CPAP - none  Diabetes - n/a  Blood Thinner Instructions:  n/a  Aspirin Instructions: n/a  NPO   Anesthesia review: No

## 2023-06-09 NOTE — Anesthesia Preprocedure Evaluation (Addendum)
 Anesthesia Evaluation  Patient identified by MRN, date of birth, ID band Patient awake    Reviewed: Allergy & Precautions, NPO status , Patient's Chart, lab work & pertinent test results  History of Anesthesia Complications Negative for: history of anesthetic complications  Airway Mallampati: II  TM Distance: >3 FB Neck ROM: Full    Dental  (+) Dental Advisory Given   Pulmonary neg pulmonary ROS   Pulmonary exam normal breath sounds clear to auscultation       Cardiovascular negative cardio ROS  Rhythm:Regular Rate:Normal     Neuro/Psych  Headaches, neg Seizures    GI/Hepatic negative GI ROS, Neg liver ROS,,,cholelithiasis   Endo/Other  neg diabetes  Class 3 obesity  Renal/GU negative Renal ROS     Musculoskeletal   Abdominal  (+) + obese  Peds  Hematology negative hematology ROS (+) Lab Results      Component                Value               Date                      WBC                      10.3                05/11/2023                HGB                      13.7                05/11/2023                HCT                      40.3                05/11/2023                MCV                      91.4                05/11/2023                PLT                      274                 05/11/2023              Anesthesia Other Findings   Reproductive/Obstetrics                             Anesthesia Physical Anesthesia Plan  ASA: 3  Anesthesia Plan: General   Post-op Pain Management: Tylenol  PO (pre-op)*   Induction: Intravenous  PONV Risk Score and Plan: 3 and Ondansetron , Dexamethasone  and Treatment may vary due to age or medical condition  Airway Management Planned: Oral ETT  Additional Equipment:   Intra-op Plan:   Post-operative Plan: Extubation in OR  Informed Consent: I have reviewed the patients History and Physical, chart, labs and discussed the procedure  including the risks, benefits and alternatives for the proposed  anesthesia with the patient or authorized representative who has indicated his/her understanding and acceptance.     Dental advisory given  Plan Discussed with: CRNA and Anesthesiologist  Anesthesia Plan Comments: (Patient speaks English and declined an interpreter. I informed her that should she change her mind at any point, we could get an interpreter. She expressed understanding, answered questions appropriately, and never asked for an interpreter.  Risks of general anesthesia discussed including, but not limited to, sore throat, hoarse voice, chipped/damaged teeth, injury to vocal cords, nausea and vomiting, allergic reactions, lung infection, heart attack, stroke, and death. All questions answered. )        Anesthesia Quick Evaluation

## 2023-06-10 ENCOUNTER — Encounter (HOSPITAL_COMMUNITY)
Admission: RE | Disposition: A | Discharge status: Home / Self Care | Payer: Self-pay | Source: Home / Self Care | Attending: General Surgery

## 2023-06-10 ENCOUNTER — Ambulatory Visit (HOSPITAL_COMMUNITY)
Admission: RE | Admit: 2023-06-10 | Discharge: 2023-06-10 | Disposition: A | Discharge status: Home / Self Care | Payer: Medicaid Other | Attending: General Surgery | Admitting: General Surgery

## 2023-06-10 ENCOUNTER — Encounter (HOSPITAL_COMMUNITY): Payer: Self-pay | Admitting: General Surgery

## 2023-06-10 ENCOUNTER — Other Ambulatory Visit: Payer: Self-pay

## 2023-06-10 ENCOUNTER — Ambulatory Visit (HOSPITAL_BASED_OUTPATIENT_CLINIC_OR_DEPARTMENT_OTHER): Payer: Medicaid Other | Admitting: Anesthesiology

## 2023-06-10 ENCOUNTER — Ambulatory Visit (HOSPITAL_COMMUNITY): Payer: Medicaid Other

## 2023-06-10 ENCOUNTER — Ambulatory Visit (HOSPITAL_COMMUNITY): Payer: Medicaid Other | Admitting: Anesthesiology

## 2023-06-10 DIAGNOSIS — K8020 Calculus of gallbladder without cholecystitis without obstruction: Secondary | ICD-10-CM | POA: Diagnosis present

## 2023-06-10 DIAGNOSIS — K8010 Calculus of gallbladder with chronic cholecystitis without obstruction: Secondary | ICD-10-CM | POA: Diagnosis not present

## 2023-06-10 HISTORY — PX: CHOLECYSTECTOMY: SHX55

## 2023-06-10 LAB — POCT PREGNANCY, URINE: Preg Test, Ur: NEGATIVE

## 2023-06-10 SURGERY — LAPAROSCOPIC CHOLECYSTECTOMY
Anesthesia: General | Site: Abdomen

## 2023-06-10 MED ORDER — AMISULPRIDE (ANTIEMETIC) 5 MG/2ML IV SOLN: 10.0000 mg | Freq: Once | INTRAVENOUS | Status: DC | PRN

## 2023-06-10 MED ORDER — LIDOCAINE 2% (20 MG/ML) 5 ML SYRINGE
INTRAMUSCULAR | Status: DC | PRN
  Administered 2023-06-10: 60 mg via INTRAVENOUS

## 2023-06-10 MED ORDER — OXYCODONE HCL 5 MG PO TABS
5.0000 mg | ORAL_TABLET | Freq: Once | ORAL | Status: AC | PRN
  Administered 2023-06-10: 5 mg via ORAL

## 2023-06-10 MED ORDER — SODIUM CHLORIDE 0.9 % IR SOLN
Status: DC | PRN
  Administered 2023-06-10: 250 mL

## 2023-06-10 MED ORDER — OXYCODONE HCL 5 MG/5ML PO SOLN: 5.0000 mg | Freq: Once | ORAL | Status: AC | PRN

## 2023-06-10 MED ORDER — PROPOFOL 10 MG/ML IV BOLUS
INTRAVENOUS | Status: DC | PRN
  Administered 2023-06-10: 200 mg via INTRAVENOUS

## 2023-06-10 MED ORDER — CHLORHEXIDINE GLUCONATE 0.12 % MT SOLN: 15.0000 mL | Freq: Once | OROMUCOSAL | Status: AC

## 2023-06-10 MED ORDER — ENOXAPARIN SODIUM 40 MG/0.4ML IJ SOSY
40.0000 mg | PREFILLED_SYRINGE | Freq: Once | INTRAMUSCULAR | Status: AC
  Administered 2023-06-10: 40 mg via SUBCUTANEOUS
  Filled 2023-06-10: qty 0.4

## 2023-06-10 MED ORDER — ACETAMINOPHEN 500 MG PO TABS: 1000.0000 mg | ORAL_TABLET | Freq: Once | ORAL | Status: AC

## 2023-06-10 MED ORDER — OXYCODONE HCL 5 MG PO TABS: 5.0000 mg | ORAL_TABLET | ORAL | 0 refills | Status: AC | PRN

## 2023-06-10 MED ORDER — FENTANYL CITRATE (PF) 250 MCG/5ML IJ SOLN
INTRAMUSCULAR | Status: AC
  Filled 2023-06-10: qty 5

## 2023-06-10 MED ORDER — FENTANYL CITRATE (PF) 100 MCG/2ML IJ SOLN
INTRAMUSCULAR | Status: AC
  Filled 2023-06-10: qty 2

## 2023-06-10 MED ORDER — SODIUM CHLORIDE 0.9 % IV SOLN
INTRAVENOUS | Status: DC | PRN
  Administered 2023-06-10: 20 mL

## 2023-06-10 MED ORDER — CHLORHEXIDINE GLUCONATE CLOTH 2 % EX PADS: 6.0000 | MEDICATED_PAD | Freq: Once | CUTANEOUS | Status: DC

## 2023-06-10 MED ORDER — OXYCODONE HCL 5 MG PO TABS
ORAL_TABLET | ORAL | Status: AC
  Filled 2023-06-10: qty 1

## 2023-06-10 MED ORDER — BUPIVACAINE-EPINEPHRINE (PF) 0.25% -1:200000 IJ SOLN
INTRAMUSCULAR | Status: AC
  Filled 2023-06-10: qty 30

## 2023-06-10 MED ORDER — FENTANYL CITRATE (PF) 250 MCG/5ML IJ SOLN
INTRAMUSCULAR | Status: DC | PRN
  Administered 2023-06-10 (×5): 50 ug via INTRAVENOUS

## 2023-06-10 MED ORDER — MIDAZOLAM HCL 2 MG/2ML IJ SOLN
INTRAMUSCULAR | Status: DC | PRN
  Administered 2023-06-10: 2 mg via INTRAVENOUS

## 2023-06-10 MED ORDER — CEFAZOLIN SODIUM-DEXTROSE 2-4 GM/100ML-% IV SOLN
2.0000 g | INTRAVENOUS | Status: AC
  Administered 2023-06-10: 2 g via INTRAVENOUS

## 2023-06-10 MED ORDER — ONDANSETRON HCL 4 MG/2ML IJ SOLN
INTRAMUSCULAR | Status: DC | PRN
  Administered 2023-06-10: 4 mg via INTRAVENOUS

## 2023-06-10 MED ORDER — DEXAMETHASONE SODIUM PHOSPHATE 10 MG/ML IJ SOLN
INTRAMUSCULAR | Status: DC | PRN
  Administered 2023-06-10: 4 mg via INTRAVENOUS

## 2023-06-10 MED ORDER — 0.9 % SODIUM CHLORIDE (POUR BTL) OPTIME
TOPICAL | Status: DC | PRN
  Administered 2023-06-10: 1000 mL

## 2023-06-10 MED ORDER — ACETAMINOPHEN 500 MG PO TABS
ORAL_TABLET | ORAL | Status: AC
  Administered 2023-06-10: 1000 mg via ORAL
  Filled 2023-06-10: qty 2

## 2023-06-10 MED ORDER — PROPOFOL 10 MG/ML IV BOLUS
INTRAVENOUS | Status: AC
  Filled 2023-06-10: qty 20

## 2023-06-10 MED ORDER — ROCURONIUM BROMIDE 10 MG/ML (PF) SYRINGE
PREFILLED_SYRINGE | INTRAVENOUS | Status: DC | PRN
  Administered 2023-06-10: 50 mg via INTRAVENOUS
  Administered 2023-06-10: 10 mg via INTRAVENOUS

## 2023-06-10 MED ORDER — DEXMEDETOMIDINE HCL IN NACL 80 MCG/20ML IV SOLN
INTRAVENOUS | Status: DC | PRN
  Administered 2023-06-10: 8 ug via INTRAVENOUS

## 2023-06-10 MED ORDER — ORAL CARE MOUTH RINSE: 15.0000 mL | Freq: Once | OROMUCOSAL | Status: AC

## 2023-06-10 MED ORDER — FENTANYL CITRATE (PF) 100 MCG/2ML IJ SOLN
25.0000 ug | INTRAMUSCULAR | Status: DC | PRN
  Administered 2023-06-10 (×2): 25 ug via INTRAVENOUS
  Administered 2023-06-10: 50 ug via INTRAVENOUS

## 2023-06-10 MED ORDER — CEFAZOLIN SODIUM-DEXTROSE 2-4 GM/100ML-% IV SOLN
INTRAVENOUS | Status: AC
  Filled 2023-06-10: qty 100

## 2023-06-10 MED ORDER — CHLORHEXIDINE GLUCONATE 0.12 % MT SOLN
OROMUCOSAL | Status: AC
  Administered 2023-06-10: 15 mL via OROMUCOSAL
  Filled 2023-06-10: qty 15

## 2023-06-10 MED ORDER — BUPIVACAINE-EPINEPHRINE 0.25% -1:200000 IJ SOLN
INTRAMUSCULAR | Status: DC | PRN
  Administered 2023-06-10: 13 mL
  Administered 2023-06-10: 17 mL

## 2023-06-10 MED ORDER — MIDAZOLAM HCL 2 MG/2ML IJ SOLN
INTRAMUSCULAR | Status: AC
  Filled 2023-06-10: qty 2

## 2023-06-10 MED ORDER — LACTATED RINGERS IV SOLN: INTRAVENOUS | Status: DC

## 2023-06-10 MED ORDER — SUGAMMADEX SODIUM 200 MG/2ML IV SOLN
INTRAVENOUS | Status: DC | PRN
  Administered 2023-06-10: 200 mg via INTRAVENOUS

## 2023-06-10 SURGICAL SUPPLY — 41 items
BAG COUNTER SPONGE SURGICOUNT (BAG) ×1 IMPLANT
BLADE CLIPPER SURG (BLADE) IMPLANT
CANISTER SUCT 3000ML PPV (MISCELLANEOUS) ×1 IMPLANT
CHLORAPREP W/TINT 26 (MISCELLANEOUS) ×1 IMPLANT
CLIP LIGATING HEMO O LOK GREEN (MISCELLANEOUS) ×1 IMPLANT
COVER SURGICAL LIGHT HANDLE (MISCELLANEOUS) ×1 IMPLANT
DERMABOND ADVANCED .7 DNX12 (GAUZE/BANDAGES/DRESSINGS) ×1 IMPLANT
ELECT REM PT RETURN 9FT ADLT (ELECTROSURGICAL) ×1
ELECTRODE REM PT RTRN 9FT ADLT (ELECTROSURGICAL) ×1 IMPLANT
GLOVE BIOGEL PI MICRO STRL 6 (GLOVE) ×1 IMPLANT
GLOVE INDICATOR 6.5 STRL GRN (GLOVE) ×1 IMPLANT
GOWN STRL REUS W/ TWL LRG LVL3 (GOWN DISPOSABLE) ×3 IMPLANT
GRASPER SUT TROCAR 14GX15 (MISCELLANEOUS) ×1 IMPLANT
IRRIG SUCT STRYKERFLOW 2 WTIP (MISCELLANEOUS) ×1
IRRIGATION SUCT STRKRFLW 2 WTP (MISCELLANEOUS) ×1 IMPLANT
KIT BASIN OR (CUSTOM PROCEDURE TRAY) ×1 IMPLANT
KIT IMAGING PINPOINTPAQ (MISCELLANEOUS) IMPLANT
KIT TURNOVER KIT B (KITS) ×1 IMPLANT
L-HOOK LAP DISP 36CM (ELECTROSURGICAL) ×1
LHOOK LAP DISP 36CM (ELECTROSURGICAL) ×1 IMPLANT
NDL INSUFFLATION 14GA 120MM (NEEDLE) ×1 IMPLANT
NEEDLE INSUFFLATION 14GA 120MM (NEEDLE) ×1
NS IRRIG 1000ML POUR BTL (IV SOLUTION) ×1 IMPLANT
PAD ARMBOARD 7.5X6 YLW CONV (MISCELLANEOUS) ×1 IMPLANT
PENCIL BUTTON HOLSTER BLD 10FT (ELECTRODE) ×1 IMPLANT
SCISSORS LAP 5X35 DISP (ENDOMECHANICALS) ×1 IMPLANT
SET CHOLANGIOGRAPH 5 50 .035 (SET/KITS/TRAYS/PACK) IMPLANT
SET TUBE SMOKE EVAC HIGH FLOW (TUBING) ×1 IMPLANT
SLEEVE Z-THREAD 5X100MM (TROCAR) ×2 IMPLANT
SPECIMEN JAR SMALL (MISCELLANEOUS) ×1 IMPLANT
SUT MNCRL AB 4-0 PS2 18 (SUTURE) ×1 IMPLANT
SYS BAG RETRIEVAL 10MM (BASKET) ×1
SYSTEM BAG RETRIEVAL 10MM (BASKET) ×1 IMPLANT
TOWEL GREEN STERILE (TOWEL DISPOSABLE) IMPLANT
TOWEL GREEN STERILE FF (TOWEL DISPOSABLE) ×1 IMPLANT
TRAY LAPAROSCOPIC MC (CUSTOM PROCEDURE TRAY) ×1 IMPLANT
TROCAR 11X100 Z THREAD (TROCAR) IMPLANT
TROCAR Z THREAD OPTICAL 12X100 (TROCAR) ×1 IMPLANT
TROCAR Z-THREAD OPTICAL 5X100M (TROCAR) ×1 IMPLANT
WARMER LAPAROSCOPE (MISCELLANEOUS) ×1 IMPLANT
WATER STERILE IRR 1000ML POUR (IV SOLUTION) ×1 IMPLANT

## 2023-06-10 NOTE — Op Note (Signed)
 06/10/2023 8:48 AM  PATIENT: Katherine Hess  28 y.o. female  Patient Care Team: [facility] [place], EZEQUIEL. as PCP - General  PRE-OPERATIVE DIAGNOSIS: Symptomatic cholelithiasis  POST-OPERATIVE DIAGNOSIS: Cholelithiasis with chronic cholecystitis  PROCEDURE: Laparoscopic cholecystectomy with intraoperative cholangiogram  SURGEON: Orie Silversmith, MD  ASSISTANT: Leonor Dawn, MD  ANESTHESIA: General endotracheal  EBL: 5cc  DRAINS: None  SPECIMEN: Gallbladder  COUNTS: Sponge, needle and instrument counts were reported correct x2 at the conclusion of the operation  DISPOSITION: PACU in satisfactory condition  COMPLICATIONS: None  FINDINGS: Chronically inflamed gallbladder with omental adhesions.  DESCRIPTION:  The patient was identified & brought into the operating room. She was then positioned supine on the OR table. SCDs were in place and active during the entire case. She then underwent general endotracheal anesthesia. Pressure points were padded. Hair on the abdomen was clipped by the OR team. The abdomen was prepped and draped in the standard sterile fashion. Antibiotics were administered. A surgical timeout was performed and confirmed our plan.   A periumbilical incision was made. The umbilical stalk was grasped and retracted outwardly. A veress needle was inserted and the abdomen was insufflated. The pressure was not appropriately low then increasing slowly, so reposition veress and still unable to insufflate appropriately. Elected to place veress in LUQ with no issues and appropriate insufflation.TA 5mm trocar optiview using a 30 degree scope was placed in the periumbilica site and the abdomen was entered under direct visualization. Inspection confirmed no evidence of trocar site complications. The site of veress placement showed no injury and the veress was then removed. The patient was then positioned in reverse Trendelenburg with slight left side down. A 12 mm  supxiphoid trocar was placed under direct visualization and  two additional 5mm trocars were placed along the right subcostal line - one 5mm port in mid subcostal region, another 5mm port in the right flank near the anterior axillary line.  The liver and gallbladder were inspected. . The gallbladder fundus was grasped and elevated cephalad. An additional grasper was then placed on the infundibulum of the gallbladder and the infundibulum was retracted laterally. Staying high on the gallbladder, the peritoneum on both sides of the gallbladder was opened with hook cautery. Gentle blunt dissection was then employed with a [place]  dissector working down into [employer]. The cystic duct was identified and carefully circumferentially dissected. The cystic artery was also identified and carefully circumferentially dissected. The space between the cystic artery and hepatocystic plate was developed such that a good view of the liver could be seen through a window medial to the cystic artery. The triangle of Calot had been cleared of all fibrofatty tissue. At this point, a critical view of safety was achieved and the only structures visualized was the skeletonized cystic duct laterally, the skeletonized cystic artery and the liver through the window medial to the artery. No posterior cystic artery was noted. The cystic artery was then double clipped on patient side and single clip on specimen side using 10mm metal clips and then divided sharply using laparoscopic scissors.  At this point attention was turned to performing a cholangiogram. A metal clip was placed high on cystic duct. A partial cystic duct-otomy was created. A 77F cholangiocatheter was then introduced through a puncture site at the right subcostal ridge of the abdominal wall and directed it into the cystic duct. This was then secured with a clip. A cholangiogram was then obtained using a dilute radio-opaque contrast and continuous fluoroscopy. Contrast  flowed from a side branch consistent with cystic duct cannulization. Contrast flowed proximally up the common hepatic duct. Contrast flowed distally into the common bile duct and easily across the ampulla into the duodenum.  This was consistent with a normal cholangiogram. The cholangiocatheter was then removed.  The cystic duct was then clipped with 2 metal clips on patient side. The cystic duct was then divided. The gallbladder was then freed from its remaining attachments to the liver using electrocautery and placed into an endocatch bag. The RUQ was gently irrigated with sterile saline. Hemostasis was then verified. The clips were in good position; the gallbladder fossa was dry. The rest of the abdomen was inspected no injury nor bleeding elsewhere was identified.  The endocatch bag containing the gallbladder was then removed from the subxiphoid port site and passed off as specimen. The subxiphoid port fascia was then closed in a figured of eight fashion with 0 vicryl using a suture passer. The RUQ ports were removed under direct visualization and noted to be hemostatic.SABRA The fascia was palpated and noted to be completely closed. The abdomen was then desufflated and the periumbilical trocar removed. The skin of all incision sites was approximated with 4-0 monocryl subcuticular suture and dermabond applied. The patient was then awakened from anesthesia, extubated, and transferred to a stretcher for transport to PACU in satisfactory condition.  Instrument, sponge, and needle counts were correct at closure and at the conclusion of the case.   Orie Silversmith, MD [facility] Surgery

## 2023-06-10 NOTE — Anesthesia Procedure Notes (Addendum)
 Procedure Name: Intubation Date/Time: 06/10/2023 9:12 AM  Performed by: Deirdre Olam LABOR, CRNAPre-anesthesia Checklist: Patient identified, Emergency Drugs available, Suction available and Patient being monitored Patient Re-evaluated:Patient Re-evaluated prior to induction Oxygen Delivery Method: Circle system utilized Preoxygenation: Pre-oxygenation with 100% oxygen Induction Type: IV induction Ventilation: Mask ventilation without difficulty Laryngoscope Size: 2 Grade View: Grade I Tube type: Oral Tube size: 7.0 mm Number of attempts: 1 Airway Equipment and Method: Stylet and Oral airway Placement Confirmation: ETT inserted through vocal cords under direct vision, positive ETCO2 and breath sounds checked- equal and bilateral Secured at: 22 cm Tube secured with: Tape Dental Injury: Teeth and Oropharynx as per pre-operative assessment

## 2023-06-10 NOTE — Anesthesia Postprocedure Evaluation (Signed)
 Anesthesia Post Note  Patient: Katherine Hess  Procedure(s) Performed: LAPAROSCOPIC CHOLECYSTECTOMY WITH INTRAOPERATIVE CHOLANGIOGRAM (Abdomen)     Patient location during evaluation: PACU Anesthesia Type: General Level of consciousness: awake Pain management: pain level controlled Vital Signs Assessment: post-procedure vital signs reviewed and stable Respiratory status: spontaneous breathing, nonlabored ventilation and respiratory function stable Cardiovascular status: blood pressure returned to baseline and stable Postop Assessment: no apparent nausea or vomiting Anesthetic complications: no   No notable events documented.  Last Vitals:  Vitals:   06/10/23 1100 06/10/23 1115  BP: 129/83 129/75  Pulse: 83 90  Resp: (!) 22 20  Temp:    SpO2: 100% 98%    Last Pain:  Vitals:   06/10/23 1113  TempSrc:   PainSc: 7                  Delon Aisha Arch

## 2023-06-10 NOTE — Interval H&P Note (Signed)
 History and Physical Interval Note:  06/10/2023 8:34 AM  Katherine Hess  has presented today for surgery, with the diagnosis of Symptomatic Cholelithiasis.  The various methods of treatment have been discussed with the patient and family. After consideration of risks, benefits and other options for treatment, the patient has consented to  Procedure(s): LAPAROSCOPIC CHOLECYSTECTOMY POSSIBLE INTRAOPERATIVE CHOLANGIOGRAM (N/A) as a surgical intervention.  The patient's history has been reviewed, patient examined, no change in status, stable for surgery.  I have reviewed the patient's chart and labs.  Questions were answered to the patient's satisfaction.  Examination and discussion was performed using a Spanish interpreter with [employer].   Richerd Silversmith

## 2023-06-10 NOTE — Transfer of Care (Signed)
 Immediate Anesthesia Transfer of Care Note  Patient: Katherine Hess  Procedure(s) Performed: LAPAROSCOPIC CHOLECYSTECTOMY WITH INTRAOPERATIVE CHOLANGIOGRAM (Abdomen)  Patient Location: PACU  Anesthesia Type:General  Level of Consciousness: awake, alert , oriented, and sedated  Airway & Oxygen Therapy: Patient Spontanous Breathing and Patient connected to nasal cannula oxygen  Post-op Assessment: Report given to RN  Post vital signs: Reviewed  Last Vitals:  Vitals Value Taken Time  BP 99/79   Temp    Pulse 90   Resp 14   SpO2 98     Last Pain:  Vitals:   06/10/23 0752  TempSrc: Oral  PainSc: 0-No pain         Complications: No notable events documented.

## 2023-06-11 ENCOUNTER — Encounter (HOSPITAL_COMMUNITY): Payer: Self-pay | Admitting: General Surgery

## 2023-06-13 LAB — SURGICAL PATHOLOGY

## 2023-06-26 ENCOUNTER — Encounter (HOSPITAL_COMMUNITY): Payer: Self-pay

## 2023-06-26 ENCOUNTER — Other Ambulatory Visit: Payer: Self-pay

## 2023-06-26 ENCOUNTER — Emergency Department (HOSPITAL_COMMUNITY)
Admission: EM | Admit: 2023-06-26 | Discharge: 2023-06-27 | Disposition: A | Discharge status: Home / Self Care | Payer: Medicaid Other | Attending: Emergency Medicine | Admitting: Emergency Medicine

## 2023-06-26 DIAGNOSIS — D72829 Elevated white blood cell count, unspecified: Secondary | ICD-10-CM | POA: Diagnosis not present

## 2023-06-26 DIAGNOSIS — K9189 Other postprocedural complications and disorders of digestive system: Secondary | ICD-10-CM | POA: Diagnosis not present

## 2023-06-26 DIAGNOSIS — Z1152 Encounter for screening for COVID-19: Secondary | ICD-10-CM | POA: Diagnosis not present

## 2023-06-26 DIAGNOSIS — K5900 Constipation, unspecified: Secondary | ICD-10-CM | POA: Diagnosis not present

## 2023-06-26 DIAGNOSIS — G8918 Other acute postprocedural pain: Secondary | ICD-10-CM

## 2023-06-26 LAB — CBC WITH DIFFERENTIAL/PLATELET
Abs Immature Granulocytes: 0.04 10*3/uL (ref 0.00–0.07)
Basophils Absolute: 0.1 10*3/uL (ref 0.0–0.1)
Basophils Relative: 1 %
Eosinophils Absolute: 0.9 10*3/uL — ABNORMAL HIGH (ref 0.0–0.5)
Eosinophils Relative: 9 %
HCT: 41.3 % (ref 36.0–46.0)
Hemoglobin: 14.4 g/dL (ref 12.0–15.0)
Immature Granulocytes: 0 %
Lymphocytes Relative: 24 %
Lymphs Abs: 2.6 10*3/uL (ref 0.7–4.0)
MCH: 31.4 pg (ref 26.0–34.0)
MCHC: 34.9 g/dL (ref 30.0–36.0)
MCV: 90 fL (ref 80.0–100.0)
Monocytes Absolute: 0.8 10*3/uL (ref 0.1–1.0)
Monocytes Relative: 7 %
Neutro Abs: 6.4 10*3/uL (ref 1.7–7.7)
Neutrophils Relative %: 59 %
Platelets: 327 10*3/uL (ref 150–400)
RBC: 4.59 MIL/uL (ref 3.87–5.11)
RDW: 12 % (ref 11.5–15.5)
WBC: 10.9 10*3/uL — ABNORMAL HIGH (ref 4.0–10.5)
nRBC: 0 % (ref 0.0–0.2)

## 2023-06-26 LAB — COMPREHENSIVE METABOLIC PANEL
ALT: 30 U/L (ref 0–44)
AST: 21 U/L (ref 15–41)
Albumin: 3.9 g/dL (ref 3.5–5.0)
Alkaline Phosphatase: 46 U/L (ref 38–126)
Anion gap: 9 (ref 5–15)
BUN: 6 mg/dL (ref 6–20)
CO2: 24 mmol/L (ref 22–32)
Calcium: 9.2 mg/dL (ref 8.9–10.3)
Chloride: 104 mmol/L (ref 98–111)
Creatinine, Ser: 0.71 mg/dL (ref 0.44–1.00)
GFR, Estimated: >60 mL/min (ref >60)
Glucose, Bld: 94 mg/dL (ref 70–99)
Potassium: 3.8 mmol/L (ref 3.5–5.1)
Sodium: 137 mmol/L (ref 135–145)
Total Bilirubin: 0.9 mg/dL (ref 0.0–1.2)
Total Protein: 7.2 g/dL (ref 6.5–8.1)

## 2023-06-26 LAB — URINALYSIS, ROUTINE W REFLEX MICROSCOPIC
Bacteria, UA: NONE SEEN
Bilirubin Urine: NEGATIVE
Glucose, UA: NEGATIVE mg/dL
Ketones, ur: NEGATIVE mg/dL
Leukocytes,Ua: NEGATIVE
Nitrite: NEGATIVE
Protein, ur: NEGATIVE mg/dL
Specific Gravity, Urine: 1.005 (ref 1.005–1.030)
pH: 7 (ref 5.0–8.0)

## 2023-06-26 LAB — RESP PANEL BY RT-PCR (RSV, FLU A&B, COVID)  RVPGX2
Influenza A by PCR: NEGATIVE
Influenza B by PCR: NEGATIVE
Resp Syncytial Virus by PCR: NEGATIVE
SARS Coronavirus 2 by RT PCR: NEGATIVE

## 2023-06-26 LAB — LIPASE, BLOOD: Lipase: 31 U/L (ref 11–51)

## 2023-06-26 NOTE — ED Triage Notes (Signed)
Patient had gallbladder removed 3 weeks ago and has been sore since and reports since Wednesday increased pain in right upper quad.  Patient reports worse with movements and walking.  Patient incisions looks healed no redness or drainage.

## 2023-06-26 NOTE — ED Provider Triage Note (Signed)
Emergency Medicine Provider Triage Evaluation Note  Katherine Hess , a 28 y.o. female  was evaluated in triage.  Pt complains of 4 days of RUQ abdominal pain s/p cholecystectomy done 06/10/2023. Reports 1 bowel movement 3 days ago. LMP 06/21/2023.   Endorses R sided chest pain when walking accompanied with shortness of breath, sore throat, congestion.  Denies fever, cough, n/v/d, hematochezia, hematuria, dysuria  Review of Systems  Positive: See above Negative: See above  Physical Exam  BP (!) 125/98 (BP Location: Right Arm)   Pulse 95   Temp 98.6 F (37 C)   Resp 20   Ht 5\' 6"  (1.676 m)   Wt 110.7 kg   LMP  (LMP Unknown)   SpO2 99%   BMI 39.38 kg/m  Gen:   Awake, no distress   Resp:  Normal effort  MSK:   Moves extremities without difficulty  Other:    Medical Decision Making  Medically screening exam initiated at 4:10 PM.  Appropriate orders placed.  Katherine Hess was informed that the remainder of the evaluation will be completed by another provider, this initial triage assessment does not replace that evaluation, and the importance of remaining in the ED until their evaluation is complete.     Katherine Hess, [place] 06/26/23 1614

## 2023-06-27 ENCOUNTER — Emergency Department (HOSPITAL_COMMUNITY): Payer: Medicaid Other

## 2023-06-27 LAB — PREGNANCY, URINE: Preg Test, Ur: NEGATIVE

## 2023-06-27 LAB — I-STAT CG4 LACTIC ACID, ED: Lactic Acid, Venous: 0.7 mmol/L (ref 0.5–1.9)

## 2023-06-27 MED ORDER — ONDANSETRON HCL 4 MG/2ML IJ SOLN
4.0000 mg | Freq: Three times a day (TID) | INTRAMUSCULAR | Status: DC
  Administered 2023-06-27: 4 mg via INTRAVENOUS
  Filled 2023-06-27: qty 2

## 2023-06-27 MED ORDER — FENTANYL CITRATE PF 50 MCG/ML IJ SOSY
50.0000 ug | PREFILLED_SYRINGE | Freq: Once | INTRAMUSCULAR | Status: AC
  Administered 2023-06-27: 50 ug via INTRAVENOUS
  Filled 2023-06-27: qty 1

## 2023-06-27 MED ORDER — ONDANSETRON HCL 4 MG/2ML IJ SOLN
4.0000 mg | Freq: Once | INTRAMUSCULAR | Status: AC
  Administered 2023-06-27: 4 mg via INTRAVENOUS
  Filled 2023-06-27: qty 2

## 2023-06-27 MED ORDER — MORPHINE SULFATE (PF) 4 MG/ML IV SOLN
4.0000 mg | INTRAVENOUS | Status: AC | PRN
  Administered 2023-06-27: 4 mg via INTRAVENOUS
  Filled 2023-06-27: qty 1

## 2023-06-27 MED ORDER — HYDROCODONE-ACETAMINOPHEN 5-325 MG PO TABS: 1.0000 | ORAL_TABLET | Freq: Four times a day (QID) | ORAL | 0 refills | Status: AC | PRN

## 2023-06-27 MED ORDER — ONDANSETRON HCL 4 MG/2ML IJ SOLN: 4.0000 mg | Freq: Three times a day (TID) | INTRAMUSCULAR | Status: DC

## 2023-06-27 MED ORDER — IOHEXOL 9 MG/ML PO SOLN
1000.0000 mL | Freq: Once | ORAL | Status: AC | PRN
  Administered 2023-06-27: 1000 mL via ORAL

## 2023-06-27 MED ORDER — ONDANSETRON HCL 4 MG/2ML IJ SOLN: 4.0000 mg | Freq: Once | INTRAMUSCULAR | Status: DC

## 2023-06-27 MED ORDER — IOHEXOL 350 MG/ML SOLN
75.0000 mL | Freq: Once | INTRAVENOUS | Status: AC | PRN
  Administered 2023-06-27: 75 mL via INTRAVENOUS

## 2023-06-27 MED ORDER — ONDANSETRON 4 MG PO TBDP: 4.0000 mg | ORAL_TABLET | Freq: Three times a day (TID) | ORAL | 0 refills | Status: AC | PRN

## 2023-06-27 MED ORDER — MORPHINE SULFATE (PF) 4 MG/ML IV SOLN: 4.0000 mg | Freq: Once | INTRAVENOUS | Status: DC

## 2023-06-27 NOTE — Progress Notes (Addendum)
Subjective: CC: Patient known to our service.  She is status post laparoscopic cholecystectomy with IOC by Dr. Azucena Cecil on 06/10/2023. Op note reviewed. Entry obtained by Veress needle - umbilical > LUQ. IOC negative.  She was d/c POD 0.   She reports she was doing well postop and pain initially controlled with scheduled Tylenol and q4hr oxycodone.  She ran out of oxycodone on the 15th.  She continued to progress well and was tolerating diet without nausea or vomiting, mobilizing and voiding. She has had some constipation with last BM 1/24 but continues to pass flatus. She is not taking anything for constipation.   She reports on 1/22, postop day 12, she began having epigastric abdominal pain and bloating after eating.  She tried Tylenol for this with some relief.  Her symptoms were exacerbated p.o. intake as well as movement.  Her symptoms progressively worsened to the point she was not able to mobilize from her bed to the bathroom yesterday prompting her visit to the emergency department.  In the ED she has been afebrile.  Initially tachycardic with soft BPs 98/66 that improved after IVF.  Last heart rate 77, last BP 102/69.  WBC 10.9.  Hemoglobin 14.4.  Lipase and LFTs within normal limits. Lactic wnl.  CT scan with small volume pneumoperitoneum in the upper abdomen.  No obvious fluid collection in the gallbladder fossa.  No free fluid.  Objective: Vital signs in last 24 hours: Temp:  [98.1 F (36.7 C)-98.8 F (37.1 C)] 98.1 F (36.7 C) (01/27 0813) Pulse Rate:  [77-106] 77 (01/27 0813) Resp:  [16-20] 18 (01/27 0813) BP: (98-134)/(66-98) 102/69 (01/27 0813) SpO2:  [99 %-100 %] 99 % (01/27 0813) Weight:  [110.7 kg] 110.7 kg (01/26 1609)    Intake/Output from previous day: No intake/output data recorded. Intake/Output this shift: No intake/output data recorded.  PE: Gen:  Alert, NAD, pleasant Card:  RRR Pulm:  CTAB, no W/R/R, effort normal Abd: Soft, no distension, epigastric  ttp with some grimacing but no obvious rigidity or guarding.  Otherwise NT, +BS. Incisions with glue intact appears well and are without drainage, bleeding, or signs of infection  Ext:  No LE edema  Psych: A&Ox3  Skin: no rashes noted, warm and dry  Lab Results:  Recent Labs    06/26/23 1616  WBC 10.9*  HGB 14.4  HCT 41.3  PLT 327   BMET Recent Labs    06/26/23 1616  NA 137  K 3.8  CL 104  CO2 24  GLUCOSE 94  BUN 6  CREATININE 0.71  CALCIUM 9.2   PT/INR No results for input(s): "LABPROT", "INR" in the last 72 hours. CMP     Component Value Date/Time   NA 137 06/26/2023 1616   NA 139 02/23/2017 1501   K 3.8 06/26/2023 1616   CL 104 06/26/2023 1616   CO2 24 06/26/2023 1616   GLUCOSE 94 06/26/2023 1616   BUN 6 06/26/2023 1616   BUN 7 02/23/2017 1501   CREATININE 0.71 06/26/2023 1616   CALCIUM 9.2 06/26/2023 1616   PROT 7.2 06/26/2023 1616   PROT 6.4 02/23/2017 1501   ALBUMIN 3.9 06/26/2023 1616   ALBUMIN 3.7 02/23/2017 1501   AST 21 06/26/2023 1616   ALT 30 06/26/2023 1616   ALKPHOS 46 06/26/2023 1616   BILITOT 0.9 06/26/2023 1616   BILITOT 0.3 02/23/2017 1501   GFRNONAA >60 06/26/2023 1616   GFRAA >60 12/20/2019 1444   Lipase  Component Value Date/Time   LIPASE 31 06/26/2023 1616    Studies/Results: CT ABDOMEN PELVIS W CONTRAST Result Date: 06/27/2023 CLINICAL DATA:  28 year old female status post laparoscopic cholecystectomy on 06/10/2023. EXAM: CT ABDOMEN AND PELVIS WITH CONTRAST TECHNIQUE: Multidetector CT imaging of the abdomen and pelvis was performed using the standard protocol following bolus administration of intravenous contrast. RADIATION DOSE REDUCTION: This exam was performed according to the departmental dose-optimization program which includes automated exposure control, adjustment of the mA and/or kV according to patient size and/or use of iterative reconstruction technique. CONTRAST:  75mL OMNIPAQUE IOHEXOL 350 MG/ML SOLN COMPARISON:   Preoperative CT Abdomen and Pelvis 05/12/2023. FINDINGS: Lower chest: Negative; minimal costophrenic angle atelectasis since 05/12/2023. No pericardial or pleural effusion. Hepatobiliary: Cholecystectomy clips in the gallbladder fossa which otherwise has a satisfactory recent postoperative appearance. No perihepatic fluid. Liver enhancement within normal limits. No abnormal postoperative bile duct dilatation. Pancreas: Pancreas appears within normal limits. Spleen: Negative. Adrenals/Urinary Tract: Normal adrenal glands. Nonobstructed kidneys appears symmetric and normal. Decompressed ureters and bladder. Stomach/Bowel: Postoperative changes to the ventral abdominal wall including in the right upper quadrant on series 3, image 18. No abdominal wall gas or fluid collection. But unresolved pneumoperitoneum at the level of the transverse colon, greater omentum on series 3, image 24. No regional mesenteric inflammation there. No other pneumoperitoneum identified. Negative large bowel aside from retained stool. Normal appendix tracking to the midline presacral region series 3, image 61. Decompressed terminal ileum. No dilated small bowel. Stomach contains gas and fluid, appears unremarkable. Small volume of fluid in the duodenum which appears negative. No free fluid or mesenteric inflammation identified. Vascular/Lymphatic: Major arterial structures and the portal venous system in the abdomen and pelvis appear patent and unremarkable. No atherosclerosis or lymphadenopathy identified. Reproductive: Within normal limits. Other: No pelvis free fluid. Musculoskeletal: T12 spina bifida occulta, normal variant. 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 No acute osseous abnormality identified. IMPRESSION: Small volume of pneumoperitoneum in the upper abdomen overlying the transverse colon/greater omentum. This seems greater than expected for postoperative day 17, but the other cholecystectomy changes are  satisfactory, and no free fluid or any other acute or inflammatory process is identified in the abdomen or the pelvis. Normal appendix. Electronically Signed   By: Odessa Fleming M.D.   On: 06/27/2023 04:41    Anti-infectives: Anti-infectives (From admission, onward)    None        Assessment/Plan POD 17 s/p laparoscopic cholecystectomy with intraoperative cholangiogram by Dr. Azucena Cecil on 06/10/2023 - CT with small volume pneumoperitoneum in the upper abdomen that is greater than what would be expected POD 17.  No free fluid.  No other complicating features on CT from surgery - No obvious fluid collection in the gallbladder fossa.  Will obtain CT with p.o. contrast for further evaluate. Keep NPO. Discussed with EDP. Further recs following these results.   Addnedum: CT A/P with po contrast reassuring. My attending has evaluated her and her pain has resolved. Recommend PO challenge. If any pain, n/v with po challenge, will plan to admit overnight. If tolerates PO challenge, okay for d/c. F/u has already been arranged.   I reviewed nursing notes, last 24 h vitals and pain scores, last 48 h intake and output, last 24 h labs and trends, and last 24 h imaging results.    LOS: 0 days    Jacinto Halim , [facility] 06/27/2023, 9:00 AM Please see Amion for pager number during day hours 7:00am-4:30pm

## 2023-06-27 NOTE — ED Notes (Signed)
Surgery at bedside.

## 2023-06-27 NOTE — Discharge Instructions (Addendum)
Follow-up 2/7 with general surgery as discussed.  I have sent medications to your pharmacy to take as needed however if your pain returns at general surgery recommends that you return to emergency room.  Sent Norco for pain control, Zofran for nausea.

## 2023-06-27 NOTE — ED Provider Notes (Addendum)
[place] EMERGENCY DEPARTMENT AT [facility] Provider Note   CSN: [id number] Arrival date & time: 06/26/23  1515   History  Chief Complaint  Patient presents with   Post-op Problem   Katherine Hess is a 28 y.o. female who is 17 days status post laparoscopic cholecystectomy by Dr. Rosanne Sack for symptomatic cholelithiasis. She presents with concern for right upper abdominal pain progressively worsening for the last 5 days, now severe. Her pain is elicited somewhat with movement, walking, and most significantly with eating.  No fevers or chills, some nausea but no vomiting, no diarrhea.Patient has not yet followed up with Dr. Azucena Cecil, surgeon; follow up appointment scheduled for 07/08/23.   History of headaches.  Also has been constipated since her surgery, not having regular bowel movements, very difficult to pass, no longer on narcotic pain medication, only using Tylenol.  Has not used any over-the-counter stool softeners or laxatives.  HPI     Home Medications Prior to Admission medications   Medication Sig Start Date End Date Taking? Authorizing Provider  naproxen (NAPROSYN) 500 MG tablet Take 500 mg by mouth 2 (two) times daily. 05/12/23   [provider]  oxyCODONE (ROXICODONE) 5 MG immediate release tablet Take 1 tablet (5 mg total) by mouth every 4 (four) hours as needed for severe pain (pain score 7-10). 06/10/23   Lysle Rubens, MD     Allergies    Patient has no known allergies.    Review of Systems   Review of Systems  Gastrointestinal:  Positive for abdominal pain and nausea. Negative for constipation, diarrhea and vomiting.   Physical Exam Updated Vital Signs BP 98/66 (BP Location: Right Arm)   Pulse 85   Temp 98.2 F (36.8 C) (Oral)   Resp 18   Ht 5\' 6"  (1.676 m)   Wt 110.7 kg   LMP  (LMP Unknown)   SpO2 100%   BMI 39.38 kg/m  Physical Exam Vitals and nursing note reviewed.  Constitutional:      Appearance: She is obese. She is not  ill-appearing or toxic-appearing.  HENT:     Head: Normocephalic and atraumatic.     Mouth/Throat:     Mouth: Mucous membranes are moist.     Pharynx: No oropharyngeal exudate or posterior oropharyngeal erythema.  Eyes:     General:        Right eye: No discharge.        Left eye: No discharge.     Conjunctiva/sclera: Conjunctivae normal.  Cardiovascular:     Rate and Rhythm: Normal rate and regular rhythm.     Pulses: Normal pulses.  Pulmonary:     Effort: Pulmonary effort is normal. No respiratory distress.     Breath sounds: Normal breath sounds. No wheezing or rales.  Abdominal:     General: Bowel sounds are normal. There is no distension.     Palpations: Abdomen is soft.     Tenderness: There is abdominal tenderness in the right upper quadrant and epigastric area. There is no right CVA tenderness, left CVA tenderness, guarding or rebound.    Musculoskeletal:        General: No deformity.     Cervical back: Neck supple.  Skin:    General: Skin is warm and dry.  Neurological:     Mental Status: She is alert. Mental status is at baseline.  Psychiatric:        Mood and Affect: Mood normal.    ED Results / Procedures /  Treatments   Labs (all labs ordered are listed, but only abnormal results are displayed) Labs Reviewed  CBC WITH DIFFERENTIAL/PLATELET - Abnormal; Notable for the following components:      Result Value   WBC 10.9 (*)    Eosinophils Absolute 0.9 (*)    All other components within normal limits  URINALYSIS, ROUTINE W REFLEX MICROSCOPIC - Abnormal; Notable for the following components:   Color, Urine STRAW (*)    Hgb urine dipstick MODERATE (*)    All other components within normal limits  RESP PANEL BY RT-PCR (RSV, FLU A&B, COVID)  RVPGX2  COMPREHENSIVE METABOLIC PANEL  LIPASE, BLOOD  PREGNANCY, URINE  I-STAT CG4 LACTIC ACID, ED   EKG None  Radiology CT ABDOMEN PELVIS W CONTRAST Result Date: 06/27/2023 CLINICAL DATA:  28 year old female status  post laparoscopic cholecystectomy on 06/10/2023. EXAM: CT ABDOMEN AND PELVIS WITH CONTRAST TECHNIQUE: Multidetector CT imaging of the abdomen and pelvis was performed using the standard protocol following bolus administration of intravenous contrast. RADIATION DOSE REDUCTION: This exam was performed according to the departmental dose-optimization program which includes automated exposure control, adjustment of the mA and/or kV according to patient size and/or use of iterative reconstruction technique. CONTRAST:  75mL OMNIPAQUE IOHEXOL 350 MG/ML SOLN COMPARISON:  Preoperative CT Abdomen and Pelvis 05/12/2023. FINDINGS: Lower chest: Negative; minimal costophrenic angle atelectasis since 05/12/2023. No pericardial or pleural effusion. Hepatobiliary: Cholecystectomy clips in the gallbladder fossa which otherwise has a satisfactory recent postoperative appearance. No perihepatic fluid. Liver enhancement within normal limits. No abnormal postoperative bile duct dilatation. Pancreas: Pancreas appears within normal limits. Spleen: Negative. Adrenals/Urinary Tract: Normal adrenal glands. Nonobstructed kidneys appears symmetric and normal. Decompressed ureters and bladder. Stomach/Bowel: Postoperative changes to the ventral abdominal wall including in the right upper quadrant on series 3, image 18. No abdominal wall gas or fluid collection. But unresolved pneumoperitoneum at the level of the transverse colon, greater omentum on series 3, image 24. No regional mesenteric inflammation there. No other pneumoperitoneum identified. Negative large bowel aside from retained stool. Normal appendix tracking to the midline presacral region series 3, image 61. Decompressed terminal ileum. No dilated small bowel. Stomach contains gas and fluid, appears unremarkable. Small volume of fluid in the duodenum which appears negative. No free fluid or mesenteric inflammation identified. Vascular/Lymphatic: Major arterial structures and the  portal venous system in the abdomen and pelvis appear patent and unremarkable. No atherosclerosis or lymphadenopathy identified. Reproductive: Within normal limits. Other: No pelvis free fluid. Musculoskeletal: T12 spina bifida occulta, normal variant. 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 No acute osseous abnormality identified. IMPRESSION: Small volume of pneumoperitoneum in the upper abdomen overlying the transverse colon/greater omentum. This seems greater than expected for postoperative day 17, but the other cholecystectomy changes are satisfactory, and no free fluid or any other acute or inflammatory process is identified in the abdomen or the pelvis. Normal appendix. Electronically Signed   By: Odessa Fleming M.D.   On: 06/27/2023 04:41   Procedures Procedures    Medications Ordered in ED Medications  fentaNYL (SUBLIMAZE) injection 50 mcg (50 mcg Intravenous Given 06/27/23 0346)  ondansetron (ZOFRAN) injection 4 mg (4 mg Intravenous Given 06/27/23 0346)  iohexol (OMNIPAQUE) 350 MG/ML injection 75 mL (75 mLs Intravenous Contrast Given 06/27/23 0427)    ED Course/ Medical Decision Making/ A&P Clinical Course as of 06/27/23 1610  Mon Jun 27, 2023  0501 Consult to general surgery Dr. Bedelia Person, who is agreeable to reviewing the patient's CT images and presenting  to the ED for consultation.  I appreciate her collaboration in the care of this patient. [RS]    Clinical Course User Index [RS] Sophia Cubero, Eugene Gavia, PA-C                                 Medical Decision Making 28 year old female 17 days status post laparoscopic cholecystectomy with persistent right upper quadrant pain worsening for the last 5 days.  HTN on intake, cardiopulmonary exam is unremarkable, abdominal exam is as above with exquisite RUQ TTP. Uncomfortable appearing.   The differential diagnosis for RUQ includes but is not limited to:  Cholelithiasis / choledocholithiasis / cholecystitis / cholangitis,  hepatitis (eg. viral, alcoholic, toxic),liver abscess, pancreatitis, liver / pancreatic / biliary tract cancer, ischemic hepatopathy (shock liver), hepatic vein obstruction (Budd-Chiari syndrome), liver cell adenoma, peptic ulcer disease (duodenal), functional or nonulcer dyspepsia, right lower lobe pneumonia, pyelonephritis, urinary calculi,  Fitz-Hugh-Curtis syndrome (with pelvic inflammatory disease), herpes zoster, trauma or musculoskeletal pain, herniated disk, abdominal abscess, intestinal ischemia, physical or sexual abuse, ectopic pregnancy, IUP, Mittelschmerz, ovarian cyst/torsion, threatened/ievitable abortion, PID, endometriosis, molar pregnancy, heterotopic pregnancy, corpus luteum cyst, appendicitis, UTI/renal colic, IBD.   Amount and/or Complexity of Data Reviewed Labs:     Details: CBC with leukocytosis of 10,000, CMP unremarkable, lipase is normal, pregnancy test is negative.  RVP negative, UA with moderate hemoglobin otherwise unremarkable. Radiology:     Details: CT with greater than expected pneumoperitoneum than expected day 17 post-op, appears constipated on my review of imaging.  Risk Prescription drug management.   Care of this patient signed out to oncoming ED provider J. Barrett, PA-C at time of shift change. All pertinent HPI, physical exam, and laboratory findings were discussed with them prior to my departure. Disposition of patient pending completion of workup, reevaluation, and clinical judgement of oncoming ED provider.   Pending surgery consultation and recommendations.   This chart was dictated using voice recognition software, Dragon. Despite the best efforts of this provider to proofread and correct errors, errors may still occur which can change documentation meaning.         Final Clinical Impression(s) / ED Diagnoses Final diagnoses:  None    Rx / DC Orders ED Discharge Orders     None         Sherrilee Gilles 06/27/23 [id number]     Glynn Octave, MD 06/27/23 [phone]

## 2023-06-27 NOTE — ED Notes (Signed)
Lab was called to add urine preg test to urine there

## 2023-06-27 NOTE — ED Provider Notes (Signed)
  Physical Exam  BP 98/66 (BP Location: Right Arm)   Pulse 85   Temp 98.2 F (36.8 C) (Oral)   Resp 18   Ht 5\' 6"  (1.676 m)   Wt 110.7 kg   LMP  (LMP Unknown)   SpO2 100%   BMI 39.38 kg/m   Physical Exam  Procedures  Procedures  ED Course / MDM   Clinical Course as of 06/27/23 1519  Mon Jun 27, 2023  0501 Consult to general surgery Dr. Bedelia Person, who is agreeable to reviewing the patient's CT images and presenting to the ED for consultation.  I appreciate her collaboration in the care of this patient. [RS]  [phone] Gen surg recommending Oral Contrast, once imaging is back, repage to review imaging for updated dispo.  [JB]  1405 Gen Surg recommend PO challenge for dispo. Pass PO and follow up OP, fails admit.  [JB]    Clinical Course User Index [JB] Aliese Brannum, Horald Chestnut, PA-C [RS] Sponseller, Eugene Gavia, PA-C   Medical Decision Making Risk Prescription drug management.  Patient was received in signout from prior ED provider.  In short patient is 17 days postcholecystectomy.  She is now reporting with right upper quadrant pain which started 3 days ago.  Please see prior provider's HPI for further details. Hemodynamically stable, no fever. Labs reassuring, CT scan with small pneumoperitoneum, slightly more than expected for post-op date.  Waiting for general surgery's recommendation.  Casimiro Needle, PA-C with general surgery saw patient and recommended CT scan with oral contrast.  Upon receiving results back from CT with contrast he reviewed imaging.  CT does show trace pneumoperitoneum without any other acute abnormalities.  He will reevaluate patient for further recommendations and disposition.  Patient's pain is under control at this time.  Has not had any episodes of vomiting since arrival.  Patient passed p.o. challenge without reproduction of pain, nausea or vomiting.  Since she passed p.o. challenge will discharge and have her follow-up closely with general surgery.  Patient is stable for  discharge        Smitty Knudsen, PA-C 06/27/23 1520    Coral Spikes, [place] 06/27/23 1532
